# Patient Record
Sex: Male | Born: 1945 | Race: White | Hispanic: No | Marital: Married | Smoking: Former smoker
Health system: Southern US, Community
[De-identification: ages and names within clinical notes are randomized; demographics above are authoritative.]

## PROBLEM LIST (undated history)

## (undated) DIAGNOSIS — G4733 Obstructive sleep apnea (adult) (pediatric): Secondary | ICD-10-CM

## (undated) DIAGNOSIS — D649 Anemia, unspecified: Secondary | ICD-10-CM

## (undated) DIAGNOSIS — G4730 Sleep apnea, unspecified: Secondary | ICD-10-CM

## (undated) DIAGNOSIS — I428 Other cardiomyopathies: Secondary | ICD-10-CM

## (undated) DIAGNOSIS — K219 Gastro-esophageal reflux disease without esophagitis: Secondary | ICD-10-CM

## (undated) DIAGNOSIS — E782 Mixed hyperlipidemia: Secondary | ICD-10-CM

## (undated) DIAGNOSIS — I499 Cardiac arrhythmia, unspecified: Secondary | ICD-10-CM

## (undated) DIAGNOSIS — R011 Cardiac murmur, unspecified: Secondary | ICD-10-CM

## (undated) DIAGNOSIS — U071 COVID-19: Secondary | ICD-10-CM

## (undated) DIAGNOSIS — I10 Essential (primary) hypertension: Secondary | ICD-10-CM

## (undated) DIAGNOSIS — I429 Cardiomyopathy, unspecified: Secondary | ICD-10-CM

## (undated) HISTORY — PX: CARDIAC CATHETERIZATION: SHX172

---

## 1998-12-28 HISTORY — PX: LUNG SURGERY: SHX703

## 2004-02-04 ENCOUNTER — Encounter: Payer: Self-pay | Admitting: Family Medicine

## 2004-02-24 ENCOUNTER — Ambulatory Visit: Payer: Self-pay | Admitting: Otolaryngology

## 2004-02-27 ENCOUNTER — Encounter: Payer: Self-pay | Admitting: Family Medicine

## 2004-03-29 ENCOUNTER — Encounter: Payer: Self-pay | Admitting: Family Medicine

## 2004-04-29 ENCOUNTER — Encounter: Payer: Self-pay | Admitting: Family Medicine

## 2006-10-26 ENCOUNTER — Emergency Department (HOSPITAL_COMMUNITY): Admission: EM | Admit: 2006-10-26 | Discharge: 2006-10-26 | Payer: Self-pay | Admitting: Emergency Medicine

## 2011-01-11 LAB — DIFFERENTIAL
Basophils Absolute: 0
Basophils Relative: 1
Eosinophils Absolute: 0.1
Eosinophils Relative: 2
Lymphocytes Relative: 24
Lymphs Abs: 1.5
Monocytes Absolute: 0.4
Monocytes Relative: 7
Neutro Abs: 4.3
Neutrophils Relative %: 67

## 2011-01-11 LAB — POCT I-STAT CREATININE
Creatinine, Ser: 1
Operator id: 277751

## 2011-01-11 LAB — CBC
HCT: 41
Hemoglobin: 13.9
MCHC: 34
MCV: 93.2
Platelets: 161
RBC: 4.4
RDW: 13
WBC: 6.4

## 2011-01-11 LAB — URINALYSIS, ROUTINE W REFLEX MICROSCOPIC
Glucose, UA: NEGATIVE
Ketones, ur: 15 — AB
Leukocytes, UA: NEGATIVE
Nitrite: NEGATIVE
Protein, ur: NEGATIVE
Specific Gravity, Urine: 1.028
Urobilinogen, UA: 0.2
pH: 5

## 2011-01-11 LAB — I-STAT 8, (EC8 V) (CONVERTED LAB)
Acid-Base Excess: 1
BUN: 13
Bicarbonate: 26.4 — ABNORMAL HIGH
Chloride: 105
Glucose, Bld: 131 — ABNORMAL HIGH
HCT: 41
Hemoglobin: 13.9
Operator id: 277751
Potassium: 3.7
Sodium: 140
TCO2: 28
pCO2, Ven: 43.7 — ABNORMAL LOW
pH, Ven: 7.389 — ABNORMAL HIGH

## 2011-01-11 LAB — URINE MICROSCOPIC-ADD ON

## 2013-08-03 DIAGNOSIS — I10 Essential (primary) hypertension: Secondary | ICD-10-CM | POA: Insufficient documentation

## 2013-08-03 DIAGNOSIS — K219 Gastro-esophageal reflux disease without esophagitis: Secondary | ICD-10-CM | POA: Insufficient documentation

## 2013-08-03 DIAGNOSIS — G4733 Obstructive sleep apnea (adult) (pediatric): Secondary | ICD-10-CM | POA: Insufficient documentation

## 2014-02-14 DIAGNOSIS — E782 Mixed hyperlipidemia: Secondary | ICD-10-CM | POA: Insufficient documentation

## 2014-02-25 ENCOUNTER — Ambulatory Visit: Payer: Self-pay | Admitting: Internal Medicine

## 2014-03-28 ENCOUNTER — Ambulatory Visit: Payer: Self-pay | Admitting: Internal Medicine

## 2015-04-24 DIAGNOSIS — I428 Other cardiomyopathies: Secondary | ICD-10-CM | POA: Insufficient documentation

## 2016-04-28 DIAGNOSIS — I493 Ventricular premature depolarization: Secondary | ICD-10-CM | POA: Insufficient documentation

## 2016-04-28 DIAGNOSIS — I5020 Unspecified systolic (congestive) heart failure: Secondary | ICD-10-CM | POA: Insufficient documentation

## 2016-05-20 ENCOUNTER — Other Ambulatory Visit: Payer: Self-pay | Admitting: Internal Medicine

## 2016-05-20 DIAGNOSIS — M5412 Radiculopathy, cervical region: Secondary | ICD-10-CM

## 2016-06-02 ENCOUNTER — Ambulatory Visit: Payer: Medicare Other

## 2016-06-11 ENCOUNTER — Ambulatory Visit
Admission: RE | Admit: 2016-06-11 | Discharge: 2016-06-11 | Disposition: A | Discharge status: Home / Self Care | Payer: Medicare Other | Source: Ambulatory Visit | Attending: Internal Medicine | Admitting: Internal Medicine

## 2016-06-11 DIAGNOSIS — M4802 Spinal stenosis, cervical region: Secondary | ICD-10-CM | POA: Insufficient documentation

## 2016-06-11 DIAGNOSIS — M47896 Other spondylosis, lumbar region: Secondary | ICD-10-CM | POA: Diagnosis not present

## 2016-06-11 DIAGNOSIS — M5412 Radiculopathy, cervical region: Secondary | ICD-10-CM | POA: Diagnosis present

## 2016-06-11 MED ORDER — GADOBENATE DIMEGLUMINE 529 MG/ML IV SOLN
20.0000 mL | Freq: Once | INTRAVENOUS | Status: AC | PRN
  Administered 2016-06-11: 20 mL via INTRAVENOUS

## 2016-08-13 ENCOUNTER — Encounter: Payer: Self-pay | Admitting: *Deleted

## 2016-08-16 ENCOUNTER — Ambulatory Visit
Admission: RE | Admit: 2016-08-16 | Discharge: 2016-08-16 | Disposition: A | Discharge status: Home / Self Care | Payer: Medicare Other | Source: Ambulatory Visit | Attending: Unknown Physician Specialty | Admitting: Unknown Physician Specialty

## 2016-08-16 ENCOUNTER — Encounter: Payer: Self-pay | Admitting: *Deleted

## 2016-08-16 ENCOUNTER — Encounter
Admission: RE | Disposition: A | Discharge status: Home / Self Care | Payer: Self-pay | Source: Ambulatory Visit | Attending: Unknown Physician Specialty

## 2016-08-16 ENCOUNTER — Ambulatory Visit: Payer: Medicare Other | Admitting: Certified Registered"

## 2016-08-16 DIAGNOSIS — K640 First degree hemorrhoids: Secondary | ICD-10-CM | POA: Diagnosis not present

## 2016-08-16 DIAGNOSIS — D125 Benign neoplasm of sigmoid colon: Secondary | ICD-10-CM | POA: Insufficient documentation

## 2016-08-16 DIAGNOSIS — Z7982 Long term (current) use of aspirin: Secondary | ICD-10-CM | POA: Diagnosis not present

## 2016-08-16 DIAGNOSIS — D123 Benign neoplasm of transverse colon: Secondary | ICD-10-CM | POA: Insufficient documentation

## 2016-08-16 DIAGNOSIS — Z7951 Long term (current) use of inhaled steroids: Secondary | ICD-10-CM | POA: Diagnosis not present

## 2016-08-16 DIAGNOSIS — G4730 Sleep apnea, unspecified: Secondary | ICD-10-CM | POA: Insufficient documentation

## 2016-08-16 DIAGNOSIS — Z9889 Other specified postprocedural states: Secondary | ICD-10-CM | POA: Diagnosis not present

## 2016-08-16 DIAGNOSIS — D120 Benign neoplasm of cecum: Secondary | ICD-10-CM | POA: Diagnosis not present

## 2016-08-16 DIAGNOSIS — Z1211 Encounter for screening for malignant neoplasm of colon: Secondary | ICD-10-CM | POA: Diagnosis present

## 2016-08-16 DIAGNOSIS — Z79899 Other long term (current) drug therapy: Secondary | ICD-10-CM | POA: Diagnosis not present

## 2016-08-16 DIAGNOSIS — I10 Essential (primary) hypertension: Secondary | ICD-10-CM | POA: Insufficient documentation

## 2016-08-16 DIAGNOSIS — Z87891 Personal history of nicotine dependence: Secondary | ICD-10-CM | POA: Diagnosis not present

## 2016-08-16 DIAGNOSIS — K219 Gastro-esophageal reflux disease without esophagitis: Secondary | ICD-10-CM | POA: Insufficient documentation

## 2016-08-16 DIAGNOSIS — I429 Cardiomyopathy, unspecified: Secondary | ICD-10-CM | POA: Insufficient documentation

## 2016-08-16 HISTORY — DX: Sleep apnea, unspecified: G47.30

## 2016-08-16 HISTORY — PX: COLONOSCOPY WITH PROPOFOL: SHX5780

## 2016-08-16 HISTORY — DX: Essential (primary) hypertension: I10

## 2016-08-16 HISTORY — DX: Other cardiomyopathies: I42.8

## 2016-08-16 HISTORY — DX: Gastro-esophageal reflux disease without esophagitis: K21.9

## 2016-08-16 HISTORY — DX: Cardiomyopathy, unspecified: I42.9

## 2016-08-16 SURGERY — COLONOSCOPY WITH PROPOFOL
Anesthesia: General

## 2016-08-16 MED ORDER — SODIUM CHLORIDE 0.9 % IV SOLN: INTRAVENOUS | Status: DC

## 2016-08-16 MED ORDER — PROPOFOL 500 MG/50ML IV EMUL
INTRAVENOUS | Status: DC | PRN
  Administered 2016-08-16: 150 ug/kg/min via INTRAVENOUS

## 2016-08-16 MED ORDER — PROPOFOL 10 MG/ML IV BOLUS
INTRAVENOUS | Status: DC | PRN
  Administered 2016-08-16 (×2): 25 mg via INTRAVENOUS
  Administered 2016-08-16: 50 mg via INTRAVENOUS

## 2016-08-16 MED ORDER — SODIUM CHLORIDE 0.9 % IV SOLN
INTRAVENOUS | Status: DC
  Administered 2016-08-16: 10:00:00 via INTRAVENOUS

## 2016-08-16 MED ORDER — PROPOFOL 500 MG/50ML IV EMUL
INTRAVENOUS | Status: AC
  Filled 2016-08-16: qty 50

## 2016-08-16 MED ORDER — PROPOFOL 10 MG/ML IV BOLUS
INTRAVENOUS | Status: AC
  Filled 2016-08-16: qty 20

## 2016-08-16 NOTE — Op Note (Signed)
[facility] Patient Name: Ricky Petersen Procedure Date: 08/16/2016 9:47 AM MRN: [id number] Account #: [id number] Date of Birth: [birth year] Admit Type: Outpatient Age: 71 Room: [facility] ENDO ROOM 1 Gender: Male Note Status: Finalized Procedure:            Colonoscopy Indications:          Screening for colorectal malignant neoplasm Providers:            Manya Silvas, MD Referring MD:         Tracie Harrier, MD (Referring MD) Medicines:            Propofol per Anesthesia Complications:        No immediate complications. Procedure:            Pre-Anesthesia Assessment:                       - After reviewing the risks and benefits, the patient                        was deemed in satisfactory condition to undergo the                        procedure.                       - After reviewing the risks and benefits, the patient                        was deemed in satisfactory condition to undergo the                        procedure.                       After obtaining informed consent, the colonoscope was                        passed under direct vision. Throughout the procedure,                        the patient's blood pressure, pulse, and oxygen                        saturations were monitored continuously. The Olympus                        PCF-H180AL colonoscope ( S#: [id number] ) was introduced                        through the anus and advanced to the the cecum,                        identified by appendiceal orifice and ileocecal valve.                        The colonoscopy was somewhat difficult due to                        significant looping. Successful completion of the  procedure was aided by applying abdominal pressure. The                        patient tolerated the procedure well. The quality of                        the bowel preparation was good. Findings:      A diminutive polyp was found in the  recto-sigmoid colon. The polyp was       sessile. The polyp was removed with a hot snare. Resection and retrieval       were complete.      A diminutive polyp was found in the transverse colon. The polyp was       sessile. The polyp was removed with a cold snare. Resection and       retrieval were complete.      Two sessile polyps were found in the cecum. The polyps were diminutive       in size. These polyps were removed with a jumbo cold forceps. Resection       and retrieval were complete.      Internal hemorrhoids were found during endoscopy. The hemorrhoids were       small and Grade I (internal hemorrhoids that do not prolapse).      The exam was otherwise without abnormality. Impression:           - One diminutive polyp at the recto-sigmoid colon,                        removed with a hot snare. Resected and retrieved.                       - One diminutive polyp in the transverse colon, removed                        with a cold snare. Resected and retrieved.                       - Two diminutive polyps in the cecum, removed with a                        jumbo cold forceps. Resected and retrieved.                       - Internal hemorrhoids.                       - The examination was otherwise normal. Recommendation:       - Await pathology results. Manya Silvas, MD 08/16/2016 10:37:06 AM This report has been signed electronically. Number of Addenda: 0 Note Initiated On: 08/16/2016 9:47 AM Scope Withdrawal Time: 0 hours 24 minutes 44 seconds  Total Procedure Duration: 0 hours 40 minutes 16 seconds       [facility]

## 2016-08-16 NOTE — Transfer of Care (Signed)
Immediate Anesthesia Transfer of Care Note  Patient: Ricky Petersen  Procedure(s) Performed: Procedure(s): COLONOSCOPY WITH PROPOFOL (N/A)  Patient Location: PACU  Anesthesia Type:General  Level of Consciousness: sedated and responds to stimulation  Airway & Oxygen Therapy: Patient Spontanous Breathing and Patient connected to nasal cannula oxygen  Post-op Assessment: Report given to RN and Post -op Vital signs reviewed and stable  Post vital signs: Reviewed and stable  Last Vitals:  Vitals:   08/16/16 0923  BP: 129/73  Pulse: 60  Resp: 16  Temp: 36.7 C    Last Pain:  Vitals:   08/16/16 0923  TempSrc: Tympanic         Complications: No apparent anesthesia complications

## 2016-08-16 NOTE — H&P (Signed)
   Primary Care Physician:  Tracie Harrier, MD Primary Gastroenterologist:  Dr. Vira Agar  Pre-Procedure History & Physical: HPI:  Ricky Petersen is a 71 y.o. male is here for an colonoscopy.   Past Medical History:  Diagnosis Date  . GERD (gastroesophageal reflux disease)   . Hypertension   . Idiopathic cardiomyopathy ([place])   . Sleep apnea     Past Surgical History:  Procedure Laterality Date  . CARDIAC CATHETERIZATION      Prior to Admission medications   Medication Sig Start Date End Date Taking? Authorizing Provider  aspirin EC 81 MG tablet Take 81 mg by mouth daily.   Yes [provider]  fluticasone (FLONASE) 50 MCG/ACT nasal spray Place 2 sprays into both nostrils daily.   Yes [provider]  losartan (COZAAR) 100 MG tablet Take 100 mg by mouth daily.   Yes [provider]  metoprolol succinate (TOPROL-XL) 25 MG 24 hr tablet Take 25 mg by mouth daily.   Yes [provider]  omeprazole (PRILOSEC) 20 MG capsule Take 20 mg by mouth 2 (two) times daily before a meal.   Yes [provider]    Allergies as of 06/07/2016  . (Not on File)    History reviewed. No pertinent family history.  Social History   Social History  . Marital status: Married    Spouse name: N/A  . Number of children: N/A  . Years of education: N/A   Occupational History  . Not on file.   Social History Main Topics  . Smoking status: Former Research scientist (life sciences)  . Smokeless tobacco: Never Used  . Alcohol use Yes  . Drug use: No  . Sexual activity: Not on file   Other Topics Concern  . Not on file   Social History Narrative  . No narrative on file    Review of Systems: See HPI, otherwise negative ROS  Physical Exam: BP 129/73   Pulse 60   Temp 98 F (36.7 C) (Tympanic)   Resp 16   Ht 6\' 4"  (1.93 m)   Wt 104.3 kg (230 lb)   SpO2 98%   BMI 28.00 kg/m  General:   Alert,  pleasant and cooperative in NAD Head:  Normocephalic and  atraumatic. Neck:  Supple; no masses or thyromegaly. Lungs:  Clear throughout to auscultation.    Heart:  Regular rate and rhythm. Abdomen:  Soft, nontender and nondistended. Normal bowel sounds, without guarding, and without rebound.   Neurologic:  Alert and  oriented x4;  grossly normal neurologically.  Impression/Plan: Emigdio Wildeman Gens is here for an colonoscopy to be performed for screening colonoscopy  Risks, benefits, limitations, and alternatives regarding  colonoscopy have been reviewed with the patient.  Questions have been answered.  All parties agreeable.   Gaylyn Cheers, MD  08/16/2016, 9:46 AM

## 2016-08-16 NOTE — Anesthesia Postprocedure Evaluation (Signed)
Anesthesia Post Note  Patient: Ricky Petersen  Procedure(s) Performed: Procedure(s) (LRB): COLONOSCOPY WITH PROPOFOL (N/A)  Patient location during evaluation: Endoscopy Anesthesia Type: General Level of consciousness: awake and alert Pain management: pain level controlled Vital Signs Assessment: post-procedure vital signs reviewed and stable Respiratory status: spontaneous breathing and respiratory function stable Cardiovascular status: stable Anesthetic complications: no     Last Vitals:  Vitals:   08/16/16 0923 08/16/16 1038  BP: 129/73 101/77  Pulse: 60 71  Resp: 16 19  Temp: 36.7 C (!) 35.6 C    Last Pain:  Vitals:   08/16/16 1038  TempSrc: Tympanic                 KEPHART,WILLIAM K

## 2016-08-16 NOTE — Anesthesia Post-op Follow-up Note (Cosign Needed)
Anesthesia QCDR form completed.        

## 2016-08-16 NOTE — Anesthesia Procedure Notes (Signed)
Performed by: Sariah Henkin Pre-anesthesia Checklist: Patient identified, Emergency Drugs available, Suction available, Patient being monitored and Timeout performed Patient Re-evaluated:Patient Re-evaluated prior to inductionOxygen Delivery Method: Nasal cannula Preoxygenation: Pre-oxygenation with 100% oxygen Intubation Type: IV induction       

## 2016-08-16 NOTE — Anesthesia Preprocedure Evaluation (Signed)
Anesthesia Evaluation  Patient identified by MRN, date of birth, ID band Patient awake    Reviewed: Allergy & Precautions, NPO status , Patient's Chart, lab work & pertinent test results  History of Anesthesia Complications Negative for: history of anesthetic complications  Airway Mallampati: II       Dental   Pulmonary sleep apnea and Continuous Positive Airway Pressure Ventilation , former smoker,           Cardiovascular hypertension, Pt. on medications      Neuro/Psych negative neurological ROS     GI/Hepatic Neg liver ROS, GERD  Medicated and Controlled,  Endo/Other  negative endocrine ROS  Renal/GU negative Renal ROS     Musculoskeletal   Abdominal   Peds  Hematology negative hematology ROS (+)   Anesthesia Other Findings   Reproductive/Obstetrics                            Anesthesia Physical Anesthesia Plan  ASA: II  Anesthesia Plan: General   Post-op Pain Management:    Induction: Intravenous  Airway Management Planned: Nasal Cannula  Additional Equipment:   Intra-op Plan:   Post-operative Plan:   Informed Consent: I have reviewed the patients History and Physical, chart, labs and discussed the procedure including the risks, benefits and alternatives for the proposed anesthesia with the patient or authorized representative who has indicated his/her understanding and acceptance.     Plan Discussed with:   Anesthesia Plan Comments:         Anesthesia Quick Evaluation

## 2016-08-17 LAB — SURGICAL PATHOLOGY

## 2016-08-19 ENCOUNTER — Encounter: Payer: Self-pay | Admitting: Unknown Physician Specialty

## 2017-05-23 DIAGNOSIS — G250 Essential tremor: Secondary | ICD-10-CM | POA: Insufficient documentation

## 2018-12-07 ENCOUNTER — Other Ambulatory Visit: Payer: Self-pay

## 2018-12-07 DIAGNOSIS — Z20822 Contact with and (suspected) exposure to covid-19: Secondary | ICD-10-CM

## 2018-12-08 LAB — NOVEL CORONAVIRUS, NAA: SARS-CoV-2, NAA: DETECTED — AB

## 2018-12-17 ENCOUNTER — Emergency Department: Payer: Medicare Other

## 2018-12-17 ENCOUNTER — Encounter: Payer: Self-pay | Admitting: Emergency Medicine

## 2018-12-17 ENCOUNTER — Emergency Department
Admission: EM | Admit: 2018-12-17 | Discharge: 2018-12-18 | Disposition: A | Discharge status: Other Acute Inpatient Hospital | Payer: Medicare Other | Attending: Emergency Medicine | Admitting: Emergency Medicine

## 2018-12-17 ENCOUNTER — Other Ambulatory Visit: Payer: Self-pay

## 2018-12-17 ENCOUNTER — Encounter: Payer: Self-pay | Admitting: Internal Medicine

## 2018-12-17 DIAGNOSIS — Z87891 Personal history of nicotine dependence: Secondary | ICD-10-CM | POA: Insufficient documentation

## 2018-12-17 DIAGNOSIS — R0902 Hypoxemia: Secondary | ICD-10-CM

## 2018-12-17 DIAGNOSIS — I10 Essential (primary) hypertension: Secondary | ICD-10-CM | POA: Diagnosis not present

## 2018-12-17 DIAGNOSIS — U071 COVID-19: Secondary | ICD-10-CM

## 2018-12-17 DIAGNOSIS — J9601 Acute respiratory failure with hypoxia: Secondary | ICD-10-CM

## 2018-12-17 DIAGNOSIS — R0602 Shortness of breath: Secondary | ICD-10-CM | POA: Diagnosis present

## 2018-12-17 LAB — CBC WITH DIFFERENTIAL/PLATELET
Abs Immature Granulocytes: 0.07 10*3/uL (ref 0.00–0.07)
Basophils Absolute: 0 10*3/uL (ref 0.0–0.1)
Basophils Relative: 0 %
Eosinophils Absolute: 0 10*3/uL (ref 0.0–0.5)
Eosinophils Relative: 0 %
HCT: 40.3 % (ref 39.0–52.0)
Hemoglobin: 14.4 g/dL (ref 13.0–17.0)
Immature Granulocytes: 1 %
Lymphocytes Relative: 13 %
Lymphs Abs: 1 10*3/uL (ref 0.7–4.0)
MCH: 32.2 pg (ref 26.0–34.0)
MCHC: 35.7 g/dL (ref 30.0–36.0)
MCV: 90.2 fL (ref 80.0–100.0)
Monocytes Absolute: 0.6 10*3/uL (ref 0.1–1.0)
Monocytes Relative: 8 %
Neutro Abs: 6.2 10*3/uL (ref 1.7–7.7)
Neutrophils Relative %: 78 %
Platelets: 171 10*3/uL (ref 150–400)
RBC: 4.47 MIL/uL (ref 4.22–5.81)
RDW: 12.7 % (ref 11.5–15.5)
WBC: 7.9 10*3/uL (ref 4.0–10.5)
nRBC: 0 % (ref 0.0–0.2)

## 2018-12-17 LAB — BASIC METABOLIC PANEL
Anion gap: 14 (ref 5–15)
BUN: 16 mg/dL (ref 8–23)
CO2: 27 mmol/L (ref 22–32)
Calcium: 9.1 mg/dL (ref 8.9–10.3)
Chloride: 89 mmol/L — ABNORMAL LOW (ref 98–111)
Creatinine, Ser: 0.94 mg/dL (ref 0.61–1.24)
GFR calc Af Amer: >60 mL/min (ref >60)
GFR calc non Af Amer: >60 mL/min (ref >60)
Glucose, Bld: 120 mg/dL — ABNORMAL HIGH (ref 70–99)
Potassium: 3.4 mmol/L — ABNORMAL LOW (ref 3.5–5.1)
Sodium: 130 mmol/L — ABNORMAL LOW (ref 135–145)

## 2018-12-17 LAB — FIBRIN DERIVATIVES D-DIMER (ARMC ONLY): Fibrin derivatives D-dimer (ARMC): 977.66 ng/mL (FEU) — ABNORMAL HIGH (ref 0.00–499.00)

## 2018-12-17 LAB — TROPONIN I (HIGH SENSITIVITY): Troponin I (High Sensitivity): 26 ng/L — ABNORMAL HIGH (ref <18)

## 2018-12-17 LAB — BRAIN NATRIURETIC PEPTIDE: B Natriuretic Peptide: 76 pg/mL (ref 0.0–100.0)

## 2018-12-17 MED ORDER — AZITHROMYCIN 500 MG PO TABS
500.0000 mg | ORAL_TABLET | Freq: Once | ORAL | Status: AC
  Administered 2018-12-17: 500 mg via ORAL
  Filled 2018-12-17: qty 1

## 2018-12-17 MED ORDER — IPRATROPIUM-ALBUTEROL 0.5-2.5 (3) MG/3ML IN SOLN
3.0000 mL | Freq: Once | RESPIRATORY_TRACT | Status: AC
  Administered 2018-12-17: 3 mL via RESPIRATORY_TRACT
  Filled 2018-12-17: qty 3

## 2018-12-17 MED ORDER — METHYLPREDNISOLONE SODIUM SUCC 125 MG IJ SOLR
125.0000 mg | Freq: Once | INTRAMUSCULAR | Status: AC
  Administered 2018-12-17: 125 mg via INTRAVENOUS
  Filled 2018-12-17: qty 2

## 2018-12-17 MED ORDER — HYDROXYCHLOROQUINE SULFATE 200 MG PO TABS
400.0000 mg | ORAL_TABLET | Freq: Two times a day (BID) | ORAL | Status: DC
  Administered 2018-12-17: 400 mg via ORAL
  Filled 2018-12-17 (×2): qty 2

## 2018-12-17 NOTE — ED Provider Notes (Signed)
[facility] Emergency Department Provider Note       Time seen: ----------------------------------------- 3:26 PM on 12/17/2018 -----------------------------------------   I have reviewed the triage vital signs and the nursing notes.  HISTORY   Chief Complaint Shortness of Breath    HPI Ricky Petersen is a 73 y.o. male with a history of GERD, hypertension, cardiomyopathy, sleep apnea who presents to the ED for weakness and low oxygen levels.  Patient reportedly tested positive for COVID.  He is on day 10 of a quarantine.  He was sent by his primary care doctor for chest x-ray and evaluation.  He denies any pain.  Patient states his shortness of breath is worse when he is ambulating.  He denies any other complaints, wife is also covered positive but is asymptomatic.  Past Medical History:  Diagnosis Date  . GERD (gastroesophageal reflux disease)   . Hypertension   . Idiopathic cardiomyopathy ([place])   . Sleep apnea     There are no active problems to display for this patient.   Past Surgical History:  Procedure Laterality Date  . CARDIAC CATHETERIZATION    . COLONOSCOPY WITH PROPOFOL N/A 08/16/2016   Procedure: COLONOSCOPY WITH PROPOFOL;  Surgeon: Manya Silvas, MD;  Location: [facility] ENDOSCOPY;  Service: Endoscopy;  Laterality: N/A;    Allergies Patient has no known allergies.  Social History Social History   Tobacco Use  . Smoking status: Former Research scientist (life sciences)  . Smokeless tobacco: Never Used  Substance Use Topics  . Alcohol use: Yes  . Drug use: No   Review of Systems Constitutional: Negative for fever. Cardiovascular: Negative for chest pain. Respiratory: Positive for shortness of breath Gastrointestinal: Negative for abdominal pain, vomiting and diarrhea. Musculoskeletal: Negative for back pain. Skin: Negative for rash. Neurological: Positive for weakness  All systems negative/normal/unremarkable except as stated in the  HPI  ____________________________________________   PHYSICAL EXAM:  VITAL SIGNS: ED Triage Vitals  Enc Vitals Group     BP 12/17/18 1522 123/79     Pulse Rate 12/17/18 1522 65     Resp 12/17/18 1522 18     Temp 12/17/18 1522 98 F (36.7 C)     Temp Source 12/17/18 1522 Oral     SpO2 12/17/18 1522 (!) 85 %     Weight 12/17/18 1521 229 lb 15 oz (104.3 kg)     Height --      Head Circumference --      Peak Flow --      Pain Score 12/17/18 1521 0     Pain Loc --      Pain Edu? --      Excl. in [place]? --    Constitutional: Alert and oriented. Well appearing and in no distress. Eyes: Conjunctivae are normal. Normal extraocular movements. ENT      Head: Normocephalic and atraumatic.      Nose: No congestion/rhinnorhea.      Mouth/Throat: Mucous membranes are moist.      Neck: No stridor. Cardiovascular: Normal rate, regular rhythm. No murmurs, rubs, or gallops. Respiratory: Normal respiratory effort without tachypnea nor retractions. Breath sounds are clear and equal bilaterally. No wheezes/rales/rhonchi. Gastrointestinal: Soft and nontender. Normal bowel sounds Musculoskeletal: Nontender with normal range of motion in extremities. No lower extremity tenderness nor edema. Neurologic:  Normal speech and language. No gross focal neurologic deficits are appreciated.  Skin:  Skin is warm, dry and intact. No rash noted. Psychiatric: Mood and affect are normal. Speech and behavior are  normal.  ____________________________________________  EKG: Interpreted by me.  ____________________________________________  ED COURSE:  As part of my medical decision making, I reviewed the following data within the electronic MEDICAL RECORD NUMBER History obtained from family if available, nursing notes, old chart and ekg, as well as notes from prior ED visits. Patient presented for dyspnea with COVID-19, we will assess with labs and imaging as indicated at this time.   Procedures  Darian Pepin Nell was  evaluated in Emergency Department on 12/17/2018 for the symptoms described in the history of present illness. He was evaluated in the context of the global COVID-19 pandemic, which necessitated consideration that the patient might be at risk for infection with the SARS-CoV-2 virus that causes COVID-19. Institutional protocols and algorithms that pertain to the evaluation of patients at risk for COVID-19 are in a state of rapid change based on information released by regulatory bodies including the CDC and federal and state organizations. These policies and algorithms were followed during the patient's care in the ED.  ____________________________________________   LABS (pertinent positives/negatives)  Labs Reviewed  BASIC METABOLIC PANEL - Abnormal; Notable for the following components:      Result Value   Sodium 130 (*)    Potassium 3.4 (*)    Chloride 89 (*)    Glucose, Bld 120 (*)    All other components within normal limits  FIBRIN DERIVATIVES D-DIMER ([facility] ONLY) - Abnormal; Notable for the following components:   Fibrin derivatives D-dimer ([facility]) 977.66 (*)    All other components within normal limits  TROPONIN I (HIGH SENSITIVITY) - Abnormal; Notable for the following components:   Troponin I (High Sensitivity) 26 (*)    All other components within normal limits  CBC WITH DIFFERENTIAL/PLATELET  BRAIN NATRIURETIC PEPTIDE    RADIOLOGY Images were viewed by me  Chest x-ray IMPRESSION:  1. Borderline cardiomegaly.  2. Streaky bilateral perihilar and basilar opacities. This could  represent pneumonia, atelectasis or asymmetric edema.  ____________________________________________   DIFFERENTIAL DIAGNOSIS   COVID-19, pneumonia, CHF, COPD, PE  FINAL ASSESSMENT AND PLAN  COVID-19, hypoxia   Plan: The patient had presented for COVID-19 with low oxygen levels. Patient's labs were overall unremarkable. Patient's imaging revealed perihilar and basilar opacities.  I did start him on  hydroxychloroquine, he received a dose of Solu-Medrol and azithromycin.  Patient is oxygen dependent at this point and will need hospitalization.  I will discuss with the [place].   Laurence Aly, MD    Note: This note was generated in part or whole with voice recognition software. Voice recognition is usually quite accurate but there are transcription errors that can and very often do occur. I apologize for any typographical errors that were not detected and corrected.     Earleen Newport, MD 12/17/18 [phone]

## 2018-12-17 NOTE — ED Triage Notes (Signed)
C/O weakness and oxygen levels down.  Patient has tested positive for COVID.  States he is no day 10 of quarantine.  Sent in by PCP for CXR and ED evaluation.

## 2018-12-17 NOTE — ED Notes (Signed)
Report finished att; Taran at [facility] reports crew on call - may call later for report

## 2018-12-17 NOTE — ED Notes (Signed)
Pt placed on 3L via [place] at this time, noted to desat to 88% on 2L via [place]. Pt denies further needs. Will continue to monitor for further patient needs.

## 2018-12-17 NOTE — ED Notes (Signed)
Called [facility] spoke to Abbe Amsterdam to initiate transfer to [place]

## 2018-12-18 ENCOUNTER — Other Ambulatory Visit: Payer: Self-pay

## 2018-12-18 ENCOUNTER — Inpatient Hospital Stay (HOSPITAL_COMMUNITY)
Admission: AD | Admit: 2018-12-18 | Discharge: 2018-12-23 | DRG: 177 | Disposition: A | Discharge status: Home / Self Care | Payer: Medicare Other | Source: Other Acute Inpatient Hospital | Attending: Internal Medicine | Admitting: Internal Medicine

## 2018-12-18 ENCOUNTER — Encounter (HOSPITAL_COMMUNITY): Payer: Self-pay

## 2018-12-18 DIAGNOSIS — Z79899 Other long term (current) drug therapy: Secondary | ICD-10-CM

## 2018-12-18 DIAGNOSIS — I5022 Chronic systolic (congestive) heart failure: Secondary | ICD-10-CM | POA: Diagnosis present

## 2018-12-18 DIAGNOSIS — E871 Hypo-osmolality and hyponatremia: Secondary | ICD-10-CM | POA: Diagnosis not present

## 2018-12-18 DIAGNOSIS — J9601 Acute respiratory failure with hypoxia: Secondary | ICD-10-CM

## 2018-12-18 DIAGNOSIS — U071 COVID-19: Secondary | ICD-10-CM | POA: Diagnosis present

## 2018-12-18 DIAGNOSIS — J80 Acute respiratory distress syndrome: Secondary | ICD-10-CM

## 2018-12-18 DIAGNOSIS — G4733 Obstructive sleep apnea (adult) (pediatric): Secondary | ICD-10-CM | POA: Diagnosis present

## 2018-12-18 DIAGNOSIS — Z7951 Long term (current) use of inhaled steroids: Secondary | ICD-10-CM

## 2018-12-18 DIAGNOSIS — I493 Ventricular premature depolarization: Secondary | ICD-10-CM | POA: Diagnosis present

## 2018-12-18 DIAGNOSIS — Z7982 Long term (current) use of aspirin: Secondary | ICD-10-CM | POA: Diagnosis not present

## 2018-12-18 DIAGNOSIS — I10 Essential (primary) hypertension: Secondary | ICD-10-CM | POA: Diagnosis not present

## 2018-12-18 DIAGNOSIS — I428 Other cardiomyopathies: Secondary | ICD-10-CM | POA: Diagnosis present

## 2018-12-18 DIAGNOSIS — K219 Gastro-esophageal reflux disease without esophagitis: Secondary | ICD-10-CM | POA: Diagnosis present

## 2018-12-18 DIAGNOSIS — E782 Mixed hyperlipidemia: Secondary | ICD-10-CM | POA: Diagnosis present

## 2018-12-18 DIAGNOSIS — Z9989 Dependence on other enabling machines and devices: Secondary | ICD-10-CM | POA: Diagnosis not present

## 2018-12-18 DIAGNOSIS — I110 Hypertensive heart disease with heart failure: Secondary | ICD-10-CM | POA: Diagnosis present

## 2018-12-18 DIAGNOSIS — J1289 Other viral pneumonia: Secondary | ICD-10-CM | POA: Diagnosis present

## 2018-12-18 LAB — COMPREHENSIVE METABOLIC PANEL
ALT: 35 U/L (ref 0–44)
AST: 40 U/L (ref 15–41)
Albumin: 3.1 g/dL — ABNORMAL LOW (ref 3.5–5.0)
Alkaline Phosphatase: 34 U/L — ABNORMAL LOW (ref 38–126)
Anion gap: 15 (ref 5–15)
BUN: 15 mg/dL (ref 8–23)
CO2: 26 mmol/L (ref 22–32)
Calcium: 8.9 mg/dL (ref 8.9–10.3)
Chloride: 91 mmol/L — ABNORMAL LOW (ref 98–111)
Creatinine, Ser: 0.83 mg/dL (ref 0.61–1.24)
GFR calc Af Amer: >60 mL/min (ref >60)
GFR calc non Af Amer: >60 mL/min (ref >60)
Glucose, Bld: 166 mg/dL — ABNORMAL HIGH (ref 70–99)
Potassium: 3.4 mmol/L — ABNORMAL LOW (ref 3.5–5.1)
Sodium: 132 mmol/L — ABNORMAL LOW (ref 135–145)
Total Bilirubin: 0.8 mg/dL (ref 0.3–1.2)
Total Protein: 6.6 g/dL (ref 6.5–8.1)

## 2018-12-18 LAB — CBC
HCT: 38.9 % — ABNORMAL LOW (ref 39.0–52.0)
Hemoglobin: 13.7 g/dL (ref 13.0–17.0)
MCH: 32.5 pg (ref 26.0–34.0)
MCHC: 35.2 g/dL (ref 30.0–36.0)
MCV: 92.4 fL (ref 80.0–100.0)
Platelets: 180 10*3/uL (ref 150–400)
RBC: 4.21 MIL/uL — ABNORMAL LOW (ref 4.22–5.81)
RDW: 12.7 % (ref 11.5–15.5)
WBC: 4.9 10*3/uL (ref 4.0–10.5)
nRBC: 0 % (ref 0.0–0.2)

## 2018-12-18 LAB — ABO/RH: ABO/RH(D): O NEG

## 2018-12-18 LAB — C-REACTIVE PROTEIN: CRP: 13.9 mg/dL — ABNORMAL HIGH (ref <1.0)

## 2018-12-18 LAB — D-DIMER, QUANTITATIVE: D-Dimer, Quant: 1.6 ug/mL-FEU — ABNORMAL HIGH (ref 0.00–0.50)

## 2018-12-18 LAB — TSH: TSH: 0.429 u[IU]/mL (ref 0.350–4.500)

## 2018-12-18 LAB — T4, FREE: Free T4: 1.18 ng/dL — ABNORMAL HIGH (ref 0.61–1.12)

## 2018-12-18 MED ORDER — ONDANSETRON HCL 4 MG/2ML IJ SOLN: 4.0000 mg | Freq: Four times a day (QID) | INTRAMUSCULAR | Status: DC | PRN

## 2018-12-18 MED ORDER — ADULT MULTIVITAMIN W/MINERALS CH
1.0000 | ORAL_TABLET | Freq: Every day | ORAL | Status: DC
  Administered 2018-12-18 – 2018-12-23 (×6): 1 via ORAL
  Filled 2018-12-18 (×6): qty 1

## 2018-12-18 MED ORDER — SENNOSIDES-DOCUSATE SODIUM 8.6-50 MG PO TABS: 1.0000 | ORAL_TABLET | Freq: Every evening | ORAL | Status: DC | PRN

## 2018-12-18 MED ORDER — LOSARTAN POTASSIUM 25 MG PO TABS
100.0000 mg | ORAL_TABLET | Freq: Every day | ORAL | Status: DC
  Administered 2018-12-18 – 2018-12-22 (×5): 100 mg via ORAL
  Filled 2018-12-18 (×6): qty 4

## 2018-12-18 MED ORDER — SODIUM CHLORIDE 0.9% IV SOLUTION
Freq: Once | INTRAVENOUS | Status: AC
  Administered 2018-12-18: 12:00:00 via INTRAVENOUS

## 2018-12-18 MED ORDER — ONDANSETRON HCL 4 MG PO TABS: 4.0000 mg | ORAL_TABLET | Freq: Four times a day (QID) | ORAL | Status: DC | PRN

## 2018-12-18 MED ORDER — FOLIC ACID 1 MG PO TABS
1.0000 mg | ORAL_TABLET | Freq: Every day | ORAL | Status: DC
  Administered 2018-12-18 – 2018-12-23 (×6): 1 mg via ORAL
  Filled 2018-12-18 (×6): qty 1

## 2018-12-18 MED ORDER — GUAIFENESIN-DM 100-10 MG/5ML PO SYRP: 10.0000 mL | ORAL_SOLUTION | ORAL | Status: DC | PRN

## 2018-12-18 MED ORDER — POTASSIUM CHLORIDE CRYS ER 20 MEQ PO TBCR
30.0000 meq | EXTENDED_RELEASE_TABLET | Freq: Once | ORAL | Status: AC
  Administered 2018-12-18: 30 meq via ORAL
  Filled 2018-12-18: qty 2

## 2018-12-18 MED ORDER — PANTOPRAZOLE SODIUM 40 MG PO TBEC
40.0000 mg | DELAYED_RELEASE_TABLET | Freq: Every day | ORAL | Status: DC
  Administered 2018-12-18 – 2018-12-23 (×6): 40 mg via ORAL
  Filled 2018-12-18 (×6): qty 1

## 2018-12-18 MED ORDER — IPRATROPIUM-ALBUTEROL 20-100 MCG/ACT IN AERS
1.0000 | INHALATION_SPRAY | Freq: Four times a day (QID) | RESPIRATORY_TRACT | Status: DC | PRN
  Filled 2018-12-18: qty 4

## 2018-12-18 MED ORDER — ALUM & MAG HYDROXIDE-SIMETH 200-200-20 MG/5ML PO SUSP
15.0000 mL | Freq: Once | ORAL | Status: DC | PRN
  Filled 2018-12-18: qty 30

## 2018-12-18 MED ORDER — SODIUM CHLORIDE 0.9 % IV SOLN
200.0000 mg | Freq: Once | INTRAVENOUS | Status: AC
  Administered 2018-12-18: 200 mg via INTRAVENOUS
  Filled 2018-12-18: qty 40

## 2018-12-18 MED ORDER — ACETAMINOPHEN 325 MG PO TABS: 650.0000 mg | ORAL_TABLET | Freq: Four times a day (QID) | ORAL | Status: DC | PRN

## 2018-12-18 MED ORDER — DEXAMETHASONE 6 MG PO TABS
6.0000 mg | ORAL_TABLET | ORAL | Status: DC
  Administered 2018-12-18 – 2018-12-23 (×6): 6 mg via ORAL
  Filled 2018-12-18 (×6): qty 1

## 2018-12-18 MED ORDER — ASPIRIN EC 81 MG PO TBEC
81.0000 mg | DELAYED_RELEASE_TABLET | Freq: Every day | ORAL | Status: DC
  Administered 2018-12-18 – 2018-12-23 (×6): 81 mg via ORAL
  Filled 2018-12-18 (×6): qty 1

## 2018-12-18 MED ORDER — TOCILIZUMAB 400 MG/20ML IV SOLN
800.0000 mg | Freq: Once | INTRAVENOUS | Status: AC
  Administered 2018-12-18: 800 mg via INTRAVENOUS
  Filled 2018-12-18: qty 40

## 2018-12-18 MED ORDER — FLUTICASONE PROPIONATE 50 MCG/ACT NA SUSP
2.0000 | Freq: Every day | NASAL | Status: DC
  Administered 2018-12-18 – 2018-12-23 (×6): 2 via NASAL
  Filled 2018-12-18: qty 16

## 2018-12-18 MED ORDER — METOPROLOL SUCCINATE ER 25 MG PO TB24
25.0000 mg | ORAL_TABLET | Freq: Every day | ORAL | Status: DC
  Administered 2018-12-18: 25 mg via ORAL
  Filled 2018-12-18: qty 1

## 2018-12-18 MED ORDER — ENOXAPARIN SODIUM 40 MG/0.4ML ~~LOC~~ SOLN
40.0000 mg | Freq: Every day | SUBCUTANEOUS | Status: DC
  Administered 2018-12-18 – 2018-12-23 (×6): 40 mg via SUBCUTANEOUS
  Filled 2018-12-18 (×7): qty 0.4

## 2018-12-18 MED ORDER — SODIUM CHLORIDE 0.9 % IV SOLN
100.0000 mg | INTRAVENOUS | Status: AC
  Administered 2018-12-19 – 2018-12-22 (×4): 100 mg via INTRAVENOUS
  Filled 2018-12-18 (×5): qty 20

## 2018-12-18 MED ORDER — METOPROLOL SUCCINATE ER 25 MG PO TB24
50.0000 mg | ORAL_TABLET | Freq: Every day | ORAL | Status: DC
  Administered 2018-12-19 – 2018-12-22 (×4): 50 mg via ORAL
  Filled 2018-12-18 (×5): qty 2

## 2018-12-18 MED ORDER — TRAMADOL HCL 50 MG PO TABS: 50.0000 mg | ORAL_TABLET | Freq: Four times a day (QID) | ORAL | Status: DC | PRN

## 2018-12-18 NOTE — Assessment & Plan Note (Signed)
Stable

## 2018-12-18 NOTE — Assessment & Plan Note (Signed)
Continue Cozaar and Toprol-XL

## 2018-12-18 NOTE — Assessment & Plan Note (Signed)
Continue PPI ?

## 2018-12-18 NOTE — Progress Notes (Addendum)
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  He was initially diagnosed with Covid 11 days ago on 12/07/2018, and was monitoring his symptoms at home.  Over the past 3 days he has been feeling short of breath and decided to come to the ER.  Chest x-ray showed bilateral infiltrates, he was hypoxic requiring initially 4 L nasal cannula and was admitted to the hospital  Significant events: 9/21 admit, rapid increase in O2 requirements 4L to 15 L 9/21 Remdesevir >> 9/21 Steroids >> 9/21 Convalescent plasma >>  Subjective / 24h Interval events: -Feeling a little bit more short of breath this morning. No chest pain, no palpitations  -Overnight went from 4 L to 15 L high flow  Assessment & Plan: Principal Problem:   Pneumonia due to COVID-19 virus Active Problems:   Cardiomyopathy, nonischemic (HCC)   Essential hypertension   GERD (gastroesophageal reflux disease)   Mixed hyperlipidemia   OSA on CPAP   Ventricular premature depolarization   Acute respiratory failure with hypoxia (HCC)   Hyponatremia   Principal Problem Acute Hypoxic Respiratory Failure due to Covid-19 Viral Illness -patients oxygen requirements increased significantly overnight from 4L [place] on admission to 15L this morning.  He is feeling okay at rest.  On my interview in the room his O2 sats are in the upper 80s.  We will closely monitor patient today, he appears to be developing ARDS, low threshold to move to the ICU if his oxygen requirements further increase -patient consented for Convalescent plasma.  Risks/benefits discussed with the patient, he was given material to read and agrees to plasma infusion -Given elevated CRP and  rapid progression of his hypoxia I have also discussed about use of Actemra, risks, benefits and that can potentially minimize the inflammatory response. The treatment plan and use of medications and known side effects were discussed with patient/family, they were clearly explained that there is no proven definitive treatment for COVID-19 infection, any medications used here are based on published clinical articles/anecdotal data which are not peer-reviewed or randomized control trials.  Complete risks and long-term side effects are unknown, however in the best clinical judgment they seem to be of some clinical benefit rather than medical risks.  Patient agree with the treatment plan and want to receive the given medications.  Patient agreeable to Actemra.  At patient's wishes, this was discussed with his wife as well as with his brother-in-law who is a cardiologist Carver Fila [phone]). -We will give Actemra x1 today  COVID-19 Labs  Recent Labs    12/18/18 0425  DDIMER 1.60*  CRP 13.9*    Lab Results  Component Value Date   SARSCOV2NAA Detected (A) 12/07/2018    Active Problems Nonischemic cardiomyopathy -Previous EF 40%, currently appears euvolemic.  This is thought to be due to PVC burden, and he was supposed to have an ablation at [facility] on 9/15 which was canceled because of the Covid diagnosis  Essential hypertension -Continue Cozaar and Toprol, blood pressure stable  GERD -Continue PPI  OSA on CPAP -Unable to use CPAP in the hospital  PVCs -Continue Toprol-XL, EKG shows sinus rhythm  Hyponatremia -Monitor, stable  Scheduled Meds: . sodium chloride   Intravenous Once  . aspirin EC  81 mg Oral Daily  . dexamethasone  6 mg Oral Q24H  . enoxaparin (LOVENOX) injection  40 mg Subcutaneous Daily  . fluticasone  2 spray Each Nare Daily  . folic acid  1 mg Oral Daily  . losartan  100 mg Oral Daily  . metoprolol succinate  25 mg Oral Daily  . multivitamin with minerals  1  tablet Oral Daily  . pantoprazole  40 mg Oral Daily   Continuous Infusions: . [START ON 12/19/2018] remdesivir 100 mg in NS 250 mL     PRN Meds:.acetaminophen, guaiFENesin-dextromethorphan, Ipratropium-Albuterol, ondansetron **OR** ondansetron (ZOFRAN) IV, senna-docusate, traMADol  DVT prophylaxis: Lovenox Code Status: Full code Family Communication: Called wife in the afternoon Disposition Plan: To be determined  Consultants:  None  Procedures:  None   Microbiology: None   Antimicrobials: None    Objective: Vitals:   12/18/18 0100 12/18/18 0700  BP: 127/85 123/69  Pulse: 60 60  Resp: 19 (!) 22  Temp: 98.2 F (36.8 C) 97.8 F (36.6 C)  TempSrc: Oral   SpO2: 93%   Weight: 105.8 kg   Height: 6\' 4"  (1.93 m)     Intake/Output Summary (Last 24 hours) at 12/18/2018 0957 Last data filed at 12/18/2018 0400 Gross per 24 hour  Intake 500 ml  Output 550 ml  Net -50 ml   Filed Weights   12/18/18 0100  Weight: 105.8 kg    Examination:  Constitutional: NAD Eyes: PERRL, lids and conjunctivae normal ENMT: Mucous membranes are moist. No oropharyngeal exudates Neck: normal, supple, no masses, no thyromegaly Respiratory: Rhonchi bilaterally, no wheezing, tachypneic Cardiovascular: Regular rate and rhythm, no murmurs / rubs / gallops. No LE edema. Abdomen: no tenderness. Bowel sounds positive.  Musculoskeletal: no clubbing / cyanosis.  Skin: no rashes Neurologic: CN 2-12 grossly intact. Strength 5/5 in all 4.  Psychiatric: Normal judgment and insight. Alert and oriented x 3. Normal mood.    Data Reviewed: I have independently reviewed following labs and imaging studies   CBC: Recent Labs  Lab 12/17/18 1610 12/18/18 0425  WBC 7.9 4.9  NEUTROABS 6.2  --   HGB 14.4 13.7  HCT 40.3 38.9*  MCV 90.2 92.4  PLT 171 [id number]   Basic Metabolic Panel: Recent Labs  Lab 12/17/18 1610 12/18/18 0425  NA 130* 132*  K 3.4* 3.4*  CL 89* 91*  CO2 27 26  GLUCOSE 120* 166*   BUN 16 15  CREATININE 0.94 0.83  CALCIUM 9.1 8.9   GFR: Estimated Creatinine Clearance: 104.3 mL/min (by C-G formula based on SCr of 0.83 mg/dL). Liver Function Tests: Recent Labs  Lab 12/18/18 0425  AST 40  ALT 35  ALKPHOS 34*  BILITOT 0.8  PROT 6.6  ALBUMIN 3.1*   No results for input(s): LIPASE, AMYLASE in the last 168 hours. No results for input(s): AMMONIA in the last 168 hours. Coagulation Profile: No results for input(s): INR, PROTIME in the last 168 hours. Cardiac Enzymes: No results for input(s): CKTOTAL, CKMB, CKMBINDEX, TROPONINI in the last 168 hours. BNP (last 3 results) No results for input(s): PROBNP in the last 8760 hours. HbA1C: No results for input(s): HGBA1C in the last 72 hours. CBG: No results for input(s): GLUCAP in the last 168 hours. Lipid Profile: No results for input(s): CHOL, HDL, LDLCALC, TRIG, CHOLHDL, LDLDIRECT in the last 72 hours. Thyroid Function Tests: Recent Labs    12/18/18 0425  TSH 0.429  FREET4 1.18*   Anemia Panel: No results for input(s): VITAMINB12, FOLATE, FERRITIN,  TIBC, IRON, RETICCTPCT in the last 72 hours. Urine analysis:    Component Value Date/Time   COLORURINE AMBER BIOCHEMICALS MAY BE AFFECTED BY COLOR (A) 10/26/2006 0420   APPEARANCEUR CLOUDY (A) 10/26/2006 0420   LABSPEC 1.028 10/26/2006 0420   PHURINE 5.0 10/26/2006 0420   GLUCOSEU NEGATIVE 10/26/2006 0420   HGBUR MODERATE (A) 10/26/2006 0420   BILIRUBINUR SMALL (A) 10/26/2006 0420   KETONESUR 15 (A) 10/26/2006 0420   PROTEINUR NEGATIVE 10/26/2006 0420   UROBILINOGEN 0.2 10/26/2006 0420   NITRITE NEGATIVE 10/26/2006 0420   LEUKOCYTESUR NEGATIVE 10/26/2006 0420   Sepsis Labs: Invalid input(s): PROCALCITONIN, LACTICIDVEN  No results found for this or any previous visit (from the past 240 hour(s)).    Radiology Studies: Dg Chest Port 1 View  Result Date: 12/17/2018 CLINICAL DATA:  Dyspnea, COVID-19. EXAM: PORTABLE CHEST 1 VIEW COMPARISON:  None.  FINDINGS: Borderline cardiomegaly. Streaky bilateral perihilar and bibasilar opacities. No pleural effusion or pneumothorax seen. Osseous structures about the chest are unremarkable. IMPRESSION: 1. Borderline cardiomegaly. 2. Streaky bilateral perihilar and basilar opacities. This could represent pneumonia, atelectasis or asymmetric edema. Electronically Signed   By: Franki Cabot M.D.   On: 12/17/2018 16:01    Marzetta Board, MD, PhD [facility]  Contact via  [email or web address]  [facility] Office Info P: [phone] F: [phone]  The patient is critically ill with multiple organ system failure and requires high complexity decision making for assessment and support, frequent evaluation and titration of therapies, advanced monitoring, review of radiographic studies and interpretation of complex data.    Critical Care Time devoted to patient care services, exclusive of separately billable procedures, described in this note is 35 minutes.

## 2018-12-18 NOTE — Progress Notes (Signed)
Plasma transfusion complete, well tolerated. Will continue to monitor.

## 2018-12-18 NOTE — Assessment & Plan Note (Signed)
Continue supplemental oxygen.  Wean as tolerated.

## 2018-12-18 NOTE — Assessment & Plan Note (Signed)
Patient is stopped his amiodarone.  He remains on Toprol-XL.  Patient need to reschedule his cardiac ablation at [facility].

## 2018-12-18 NOTE — H&P (Signed)
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  Patient states that he and his wife went on vacation during the Labor Day weekend.  They went to their beach house.  They went to [facility] and to [employer].  Patient started feeling symptoms of myalgias, headache, low-grade fever after they got back from Labor Day weekend.  Patient was scheduled for a cardiac ablation on December 12, 2018 at [facility].  Per his PCP recommendations, the patient end up getting tested for COVID-19 on December 07, 2018.  He was contacted on 11 September that his COVID test was positive.  He has been quarantined at home.  Patient's wife initially tested negative but subsequently tested positive a COVID-19.  Patient's was is asymptomatic.  Over the next 10 days, the patient symptoms of headache, episodic fevers continued.  Patient developed shortness of breath the last 2 to 3 days.  Most symptomatic when he was exerting himself.  Patient noted to be hypoxic today.  Sats are in the 80s percent range on admission to the ER.  Patient was placed on oxygen.  Chest x-ray demonstrates bilateral pneumonia.  Patient transferred to [place] for further management.  Patient was given IV Solu-Medrol in the ER.  Patient states that he feels improved while wearing supplemental oxygen.  Patient denies using supplemental oxygen at home on a regular basis.    ED Course: start on supplemental O2. Given IV Solumedrol  Review of Systems:  Review of Systems  Constitutional: Positive for fever and malaise/fatigue.       Anorexia   HENT: Negative.   Eyes: Negative.   Respiratory: Positive for cough and shortness of breath.   Cardiovascular: Negative.   Gastrointestinal: Negative.   Genitourinary: Negative.   Musculoskeletal: Positive for myalgias.  Skin: Negative.   Neurological: Negative.   Endo/Heme/Allergies: Negative.   Psychiatric/Behavioral: Negative.   All other systems reviewed and are negative.   Past Medical History:  Diagnosis Date  . GERD (gastroesophageal reflux disease)   . Hypertension   . Idiopathic cardiomyopathy ([place])   . Sleep apnea     Past Surgical History:  Procedure Laterality Date  . CARDIAC CATHETERIZATION    . COLONOSCOPY WITH PROPOFOL N/A 08/16/2016   Procedure: COLONOSCOPY WITH PROPOFOL;  Surgeon: Manya Silvas, MD;  Location[facility] ENDOSCOPY;  Service: Endoscopy;  Laterality: N/A;     reports that he has quit smoking. He has never used smokeless tobacco. He reports current alcohol use. He reports that he does not use drugs.  No Known Allergies  History reviewed. No pertinent family history.  Prior to Admission medications   Medication Sig Start Date End Date Taking? Authorizing Provider  aspirin EC 81 MG tablet Take 81 mg by mouth daily.    [provider]  fluticasone (FLONASE) 50 MCG/ACT nasal spray Place 2 sprays into both nostrils daily.    [provider]  losartan (COZAAR) 100 MG tablet Take 100 mg by mouth daily.    [provider]  metoprolol succinate (TOPROL-XL) 25 MG 24 hr tablet Take 25 mg by mouth daily.    [provider]  omeprazole (PRILOSEC) 20 MG capsule Take 20 mg by mouth 2 (two) times daily before a meal.    [provider]    Physical Exam: Vitals:   12/18/18 0100  BP: 127/85  Pulse: 60  Resp: 19  Temp: 98.2 F (36.8 C)  TempSrc: Oral  SpO2: 93%    Physical Exam  Nursing note and vitals reviewed. Constitutional: He is oriented to person, place, and time. He appears well-developed and  well-nourished. No distress.  HENT:  Head: Normocephalic and atraumatic.  Nose: Nose normal.  Eyes: Pupils are equal, round, and reactive to light. Right eye exhibits no discharge. Left eye exhibits no discharge.  Neck: No JVD present. No tracheal deviation present.  Cardiovascular: Normal rate and regular rhythm.  Respiratory: He has rales in the right middle field, the right lower field, the left middle field and the left lower field.  GI: Soft. Bowel sounds are normal. He exhibits no distension. There is no abdominal tenderness. There is no rebound.  Musculoskeletal:        General: No deformity or edema.  Lymphadenopathy:    He has no cervical adenopathy.  Neurological: He is alert and oriented to person, place, and time. He has normal reflexes.  Skin: Skin is warm and dry.  Psychiatric: He has a normal mood and affect. His behavior is normal. Judgment and thought content normal.     Labs on Admission: I have personally reviewed following labs and imaging studies  Lab Results  Component Value Date/Time   SARSCOV2NAA Detected (A) 12/07/2018 12:00 AM   CBC: Recent Labs  Lab 12/17/18 1610  WBC 7.9  NEUTROABS 6.2  HGB 14.4  HCT 40.3  MCV 90.2  PLT [id number]   Basic Metabolic Panel: Recent Labs  Lab 12/17/18 1610  NA 130*  K 3.4*  CL 89*  CO2 27  GLUCOSE 120*  BUN 16  CREATININE 0.94  CALCIUM 9.1   GFR: CrCl cannot be calculated (Unknown ideal weight.). Liver Function Tests: No results for input(s): AST, ALT, ALKPHOS, BILITOT, PROT, ALBUMIN in the last 168 hours. No results for input(s): LIPASE, AMYLASE in the last 168 hours. No results for input(s): AMMONIA in the last 168 hours. Coagulation Profile: No results for input(s): INR, PROTIME in the last 168 hours. Cardiac Enzymes: No results for input(s): CKTOTAL, CKMB, CKMBINDEX, TROPONINI in the last 168 hours. BNP (last 3 results) No results for input(s): PROBNP in the last 8760 hours. HbA1C: No results for  input(s): HGBA1C in the last 72 hours. CBG: No results for input(s): GLUCAP in the last 168 hours. Lipid Profile: No results for input(s): CHOL, HDL, LDLCALC, TRIG, CHOLHDL, LDLDIRECT in the last 72 hours. Thyroid Function Tests: No results for input(s): TSH, T4TOTAL, FREET4, T3FREE, THYROIDAB in the last 72 hours. Anemia Panel: No results for input(s): VITAMINB12, FOLATE, FERRITIN, TIBC, IRON, RETICCTPCT in the last 72 hours. Urine analysis:    Component Value Date/Time   COLORURINE AMBER BIOCHEMICALS MAY BE AFFECTED BY COLOR (A) 10/26/2006 0420   APPEARANCEUR CLOUDY (A) 10/26/2006 0420   LABSPEC 1.028 10/26/2006 0420   PHURINE 5.0 10/26/2006 0420   GLUCOSEU NEGATIVE 10/26/2006 0420   HGBUR MODERATE (A) 10/26/2006 0420   BILIRUBINUR SMALL (A) 10/26/2006 0420   KETONESUR 15 (A) 10/26/2006 0420   PROTEINUR NEGATIVE 10/26/2006 0420   UROBILINOGEN 0.2 10/26/2006 0420   NITRITE NEGATIVE 10/26/2006 0420   LEUKOCYTESUR NEGATIVE 10/26/2006 0420    Radiological Exams on Admission: I have personally reviewed  images Dg Chest Port 1 View  Result Date: 12/17/2018 CLINICAL DATA:  Dyspnea, COVID-19. EXAM: PORTABLE CHEST 1 VIEW COMPARISON:  None. FINDINGS: Borderline cardiomegaly. Streaky bilateral perihilar and bibasilar opacities. No pleural effusion or pneumothorax seen. Osseous structures about the chest are unremarkable. IMPRESSION: 1. Borderline cardiomegaly. 2. Streaky bilateral perihilar and basilar opacities. This could represent pneumonia, atelectasis or asymmetric edema. Electronically Signed   By: Franki Cabot M.D.   On: 12/17/2018 16:01      EKG: I have personally reviewed EKG: NSR    Assessment/Plan Principal Problem:   Pneumonia due to COVID-19 virus Active Problems:   Acute respiratory failure with hypoxia (HCC)   Hyponatremia   Cardiomyopathy, nonischemic (HCC)   Essential hypertension   GERD (gastroesophageal reflux disease)   Mixed hyperlipidemia   OSA on CPAP    Ventricular premature depolarization    Pneumonia due to COVID-19 virus Admit to medical bed.  Airborne precautions.  Continue steroids with Decadron 6 mg daily.  Start remdesivir.  Check daily labs including ferritin.  Acute respiratory failure with hypoxia (HCC) Continue supplemental oxygen.  Wean as tolerated.  Cardiomyopathy, nonischemic (HCC) EF of 40%.  Patient will need to re-scheduled for cardiac ablation at [facility].  Essential hypertension Continue Cozaar and Toprol-XL.  GERD (gastroesophageal reflux disease) Continue PPI.  Mixed hyperlipidemia Stable.  OSA on CPAP RT to assess.  Ventricular premature depolarization Patient is stopped his amiodarone.  He remains on Toprol-XL.  Patient need to reschedule his cardiac ablation at [facility].  Hyponatremia Check TSH, FT4   DVT prophylaxis: Lovenox Code Status: Full Code Family Communication: none  Disposition Plan: DC to home when completed Remdesivir and O2 has been weaned to RA. Consults called: none  Admission status: Inpatient, Med-Surg   Kristopher Oppenheim, DO [facility] 12/18/2018, 1:30 AM

## 2018-12-18 NOTE — TOC Initial Note (Signed)
Transition of Care [facility]) - Initial/Assessment Note    Patient Details  Name: Ricky Petersen MRN: [id number] Date of Birth: Apr 17, 1945  Transition of Care [facility]) CM/SW Contact:    Carles Collet, RN Phone Number: 12/18/2018, 10:50 AM  Clinical Narrative:             Spoke with patient over the phone. He states he live at home with his wife who is also positive but has no symptoms. He describes himself as independent PTA, he has no DME at home other than a CPAP machine that is good working order. Both he and his wife drive and she will be able to provide transportation home.  He uses [facility] on [address] and wil lplan on using their drive through service for prescriptions after DC.  His PCP is Dr Roetta Sessions at [facility] and patient states he is familiar with televisits.       Expected Discharge Plan: Home/Self Care Barriers to Discharge: Continued Medical Work up   Patient Goals and CMS Choice Patient states their goals for this hospitalization and ongoing recovery are:: to return home      Expected Discharge Plan and Services Expected Discharge Plan: Home/Self Care       Living arrangements for the past 2 months: Single Family Home                                      Prior Living Arrangements/Services Living arrangements for the past 2 months: Single Family Home Lives with:: Spouse                   Activities of Daily Living Home Assistive Devices/Equipment: CPAP ADL Screening (condition at time of admission) Patient's cognitive ability adequate to safely complete daily activities?: Yes Is the patient deaf or have difficulty hearing?: No Does the patient have difficulty seeing, even when wearing glasses/contacts?: No Does the patient have difficulty concentrating, remembering, or making decisions?: No Patient able to express need for assistance with ADLs?: Yes Does the patient have difficulty dressing or bathing?: No Independently performs ADLs?: Yes (appropriate  for developmental age) Does the patient have difficulty walking or climbing stairs?: No Weakness of Legs: None Weakness of Arms/Hands: None  Permission Sought/Granted                  Emotional Assessment              Admission diagnosis:  COVID-19 Patient Active Problem List   Diagnosis Date Noted  . Hyponatremia 12/18/2018  . Pneumonia due to COVID-19 virus 12/17/2018  . Acute respiratory failure with hypoxia ([place]) 12/17/2018  . Essential tremor 05/23/2017  . Systolic congestive heart failure ([place]) 04/28/2016  . Ventricular premature depolarization 04/28/2016  . Cardiomyopathy, nonischemic ([place]) 04/24/2015  . Mixed hyperlipidemia 02/14/2014  . Essential hypertension 08/03/2013  . GERD (gastroesophageal reflux disease) 08/03/2013  . OSA on CPAP 08/03/2013   PCP:  Tracie Harrier, MD Pharmacy:  No Pharmacies Listed    Social Determinants of Health (SDOH) Interventions    Readmission Risk Interventions No flowsheet data found.

## 2018-12-18 NOTE — Assessment & Plan Note (Addendum)
Admit to medical bed.  Airborne precautions.  Continue steroids with Decadron 6 mg daily.  Start remdesivir.  Check daily labs including ferritin.

## 2018-12-18 NOTE — Progress Notes (Addendum)
Patient currently on 15L HFNC sats are 89-91%. No s/s of pain or distress. Patient requested to have his laptop dropped of to facility so that he could complete his work, patient advised as per [facility] that he can have it dropped of but [facility] is not assuming responsibility of the device if anything occurs, patient verbalized understanding. Will continue to monitor for remainder of shift.

## 2018-12-18 NOTE — Assessment & Plan Note (Signed)
Check TSH, FT4 

## 2018-12-18 NOTE — Subjective & Objective (Signed)
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  Patient states that he and his wife went on vacation during the Labor Day weekend.  They went to their beach house.  They went to [facility] and to [employer].  Patient started feeling symptoms of myalgias, headache, low-grade fever after they got back from Labor Day weekend.  Patient was scheduled for a cardiac ablation on December 12, 2018 at [facility].  Per his PCP recommendations, the patient end up getting tested for COVID-19 on December 07, 2018.  He was contacted on 11 September that his COVID test was positive.  He has been quarantined at home.  Patient's wife initially tested negative but subsequently tested positive a COVID-19.  Patient's was is asymptomatic.  Over the next 10 days, the patient symptoms of headache, episodic fevers continued.  Patient developed shortness of breath the last 2 to 3 days.  Most symptomatic when he was exerting himself.  Patient noted to be hypoxic today.  Sats are in the 80s percent range on admission to the ER.  Patient was placed on oxygen.  Chest x-ray demonstrates bilateral pneumonia.  Patient transferred to [place] for further management.  Patient was given IV Solu-Medrol in the ER.  Patient states that he feels improved while wearing supplemental oxygen.  Patient denies using supplemental oxygen at home on a regular basis.

## 2018-12-18 NOTE — Progress Notes (Signed)
Pt is resting in bed, education on pronation given. Pt is c/o acid reflux when he lays flat or prones. He is on protonix but Dr was notified and mylanta PRN ordered.

## 2018-12-18 NOTE — Assessment & Plan Note (Signed)
EF of 40%.  Patient will need to re-scheduled for cardiac ablation at [facility].

## 2018-12-18 NOTE — Assessment & Plan Note (Signed)
RT to assess.

## 2018-12-19 DIAGNOSIS — E782 Mixed hyperlipidemia: Secondary | ICD-10-CM

## 2018-12-19 DIAGNOSIS — I493 Ventricular premature depolarization: Secondary | ICD-10-CM

## 2018-12-19 DIAGNOSIS — Z9989 Dependence on other enabling machines and devices: Secondary | ICD-10-CM

## 2018-12-19 DIAGNOSIS — G4733 Obstructive sleep apnea (adult) (pediatric): Secondary | ICD-10-CM

## 2018-12-19 LAB — COMPREHENSIVE METABOLIC PANEL
ALT: 49 U/L — ABNORMAL HIGH (ref 0–44)
AST: 51 U/L — ABNORMAL HIGH (ref 15–41)
Albumin: 3.1 g/dL — ABNORMAL LOW (ref 3.5–5.0)
Alkaline Phosphatase: 36 U/L — ABNORMAL LOW (ref 38–126)
Anion gap: 10 (ref 5–15)
BUN: 27 mg/dL — ABNORMAL HIGH (ref 8–23)
CO2: 29 mmol/L (ref 22–32)
Calcium: 9.2 mg/dL (ref 8.9–10.3)
Chloride: 94 mmol/L — ABNORMAL LOW (ref 98–111)
Creatinine, Ser: 0.95 mg/dL (ref 0.61–1.24)
GFR calc Af Amer: >60 mL/min (ref >60)
GFR calc non Af Amer: >60 mL/min (ref >60)
Glucose, Bld: 141 mg/dL — ABNORMAL HIGH (ref 70–99)
Potassium: 3.9 mmol/L (ref 3.5–5.1)
Sodium: 133 mmol/L — ABNORMAL LOW (ref 135–145)
Total Bilirubin: 0.9 mg/dL (ref 0.3–1.2)
Total Protein: 6.7 g/dL (ref 6.5–8.1)

## 2018-12-19 LAB — CBC WITH DIFFERENTIAL/PLATELET
Abs Immature Granulocytes: 0.17 10*3/uL — ABNORMAL HIGH (ref 0.00–0.07)
Basophils Absolute: 0 10*3/uL (ref 0.0–0.1)
Basophils Relative: 0 %
Eosinophils Absolute: 0 10*3/uL (ref 0.0–0.5)
Eosinophils Relative: 0 %
HCT: 39.4 % (ref 39.0–52.0)
Hemoglobin: 13.6 g/dL (ref 13.0–17.0)
Immature Granulocytes: 1 %
Lymphocytes Relative: 7 %
Lymphs Abs: 0.9 10*3/uL (ref 0.7–4.0)
MCH: 32.2 pg (ref 26.0–34.0)
MCHC: 34.5 g/dL (ref 30.0–36.0)
MCV: 93.4 fL (ref 80.0–100.0)
Monocytes Absolute: 0.7 10*3/uL (ref 0.1–1.0)
Monocytes Relative: 5 %
Neutro Abs: 11.3 10*3/uL — ABNORMAL HIGH (ref 1.7–7.7)
Neutrophils Relative %: 87 %
Platelets: 233 10*3/uL (ref 150–400)
RBC: 4.22 MIL/uL (ref 4.22–5.81)
RDW: 12.6 % (ref 11.5–15.5)
WBC: 13 10*3/uL — ABNORMAL HIGH (ref 4.0–10.5)
nRBC: 0 % (ref 0.0–0.2)

## 2018-12-19 LAB — BPAM FFP
Blood Product Expiration Date: 202009221226
ISSUE DATE / TIME: 202009211228
Unit Type and Rh: 5100

## 2018-12-19 LAB — PREPARE FRESH FROZEN PLASMA: Unit division: 0

## 2018-12-19 LAB — MAGNESIUM: Magnesium: 2.7 mg/dL — ABNORMAL HIGH (ref 1.7–2.4)

## 2018-12-19 LAB — FERRITIN: Ferritin: 952 ng/mL — ABNORMAL HIGH (ref 24–336)

## 2018-12-19 LAB — D-DIMER, QUANTITATIVE: D-Dimer, Quant: 1.58 ug/mL-FEU — ABNORMAL HIGH (ref 0.00–0.50)

## 2018-12-19 LAB — C-REACTIVE PROTEIN: CRP: 7.1 mg/dL — ABNORMAL HIGH (ref <1.0)

## 2018-12-19 MED ORDER — FUROSEMIDE 10 MG/ML IJ SOLN
40.0000 mg | Freq: Once | INTRAMUSCULAR | Status: AC
  Administered 2018-12-19: 40 mg via INTRAVENOUS
  Filled 2018-12-19: qty 4

## 2018-12-19 NOTE — Plan of Care (Signed)
POC reviewed

## 2018-12-19 NOTE — Progress Notes (Signed)
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  He was initially diagnosed with Covid 11 days ago on 12/07/2018, and was monitoring his symptoms at home.  Over the past 3 days he has been feeling short of breath and decided to come to the ER.  Chest x-ray showed bilateral infiltrates, he was hypoxic requiring initially 4 L nasal cannula and was admitted to the hospital  Significant events: 9/21 admit, rapid increase in O2 requirements 4L to 15 L overnight 9/21 Remdesevir >> plan 5 days 9/21 Steroids >> plan 10 days 9/21 Convalescent plasma, Actemra   Subjective / 24h Interval events: -Feeling about the same as yesterday, denies any shortness of breath at rest but gets pretty dyspneic with minimal activity.  Denies any chest pain, denies any fever or chills.  Assessment & Plan: Principal Problem:   Pneumonia due to COVID-19 virus Active Problems:   Cardiomyopathy, nonischemic (HCC)   Essential hypertension   GERD (gastroesophageal reflux disease)   Mixed hyperlipidemia   OSA on CPAP   Ventricular premature depolarization   Acute respiratory failure with hypoxia (HCC)   Hyponatremia   Principal Problem Acute Hypoxic Respiratory Failure due to Covid-19 Viral Illness -Patient became progressively hypoxic shortly after admission from 4 L to 15 L high flow nasal cannula during his first night. -Started on Remdesivir, steroids, received convalescent plasma as well as Actemra -the treatment plan and use of medications and known side effects were discussed with patient/family, they were clearly explained that there is no proven definitive treatment for COVID-19 infection, any medications used here are  based on published clinical articles/anecdotal data which are not peer-reviewed or randomized control trials.  Complete risks and long-term side effects are unknown, however in the best clinical judgment they seem to be of some clinical benefit rather than medical risks.  Patient agree with the treatment plan and want to receive the given medications.  Patient agreeable to Actemra.  At patient's wishes, this was discussed with his wife as well as with his brother-in-law who is a cardiologist Ricky Petersen [phone]). -Continue to closely monitor respiratory status, he is at high risk for ICU transfer and intubation -Monitor inflammatory markers, slightly improving as below  COVID-19 Labs  Recent Labs    12/18/18 0425 12/19/18 0320  DDIMER 1.60* 1.58*  FERRITIN  --  952*  CRP 13.9* 7.1*    Lab Results  Component Value Date   SARSCOV2NAA Detected (A) 12/07/2018    Active Problems Nonischemic cardiomyopathy -Previous EF 40%.  This is thought to be due to PVC burden, and he was supposed to have an ablation at [facility] on 9/15 which was canceled because of the Covid diagnosis -Appears euvolemic, will give Lasix x1 to help a #1 as well  Essential hypertension -Continue Toprol, blood pressure stable, hold Cozaar as he will be given Lasix today  GERD -Continue PPI  OSA on CPAP -Unable to use CPAP in the hospital  PVCs -Continue Toprol-XL, EKG shows sinus rhythm  Hyponatremia -Monitor, stable  Scheduled Meds: . aspirin EC  81 mg Oral Daily  . dexamethasone  6 mg Oral Q24H  . enoxaparin (LOVENOX) injection  40 mg Subcutaneous Daily  . fluticasone  2 spray Each Nare Daily  .  folic acid  1 mg Oral Daily  . losartan  100 mg Oral Daily  . metoprolol succinate  50 mg Oral Daily  . multivitamin with minerals  1 tablet Oral Daily  . pantoprazole  40 mg Oral Daily   Continuous Infusions: . remdesivir 100 mg in NS 250 mL     PRN Meds:.acetaminophen, alum & mag hydroxide-simeth,  guaiFENesin-dextromethorphan, Ipratropium-Albuterol, ondansetron **OR** ondansetron (ZOFRAN) IV, senna-docusate, traMADol  DVT prophylaxis: Lovenox Code Status: Full code Family Communication: d/w Ricky Petersen [phone] Disposition Plan: To be determined  Consultants:  None  Procedures:  None   Microbiology: None   Antimicrobials: None    Objective: Vitals:   12/18/18 1730 12/18/18 1745 12/18/18 2037 12/19/18 0431  BP:  120/67 101/61   Pulse: 70 62 63   Resp: (!) 24 17 (!) 24   Temp:  97.6 F (36.4 C) 97.9 F (36.6 C) 98.8 F (37.1 C)  TempSrc:  Oral Oral Oral  SpO2: (!) 83%  94%   Weight:      Height:        Intake/Output Summary (Last 24 hours) at 12/19/2018 0631 Last data filed at 12/19/2018 0400 Gross per 24 hour  Intake 296 ml  Output 800 ml  Net -504 ml   Filed Weights   12/18/18 0100  Weight: 105.8 kg    Examination:  Constitutional: no distress, sitting at the edge of the bed  Eyes: no scleral icterus  ENMT: mmm Neck: no masses seen  Respiratory: rhonchi bilaterally mainly at the bases, no wheezing, moves air well Cardiovascular: Regular rate and rhythm, no murmurs.  No peripheral edema Abdomen: Soft, nontender, nondistended, bowel sounds positive.  Musculoskeletal: no clubbing / cyanosis.  Skin: No rashes seen Neurologic: No focal deficits, equal strength Psychiatric: Normal judgment and insight. Alert and oriented x 3. Normal mood.    Data Reviewed: I have independently reviewed following labs and imaging studies   CBC: Recent Labs  Lab 12/17/18 1610 12/18/18 0425 12/19/18 0320  WBC 7.9 4.9 13.0*  NEUTROABS 6.2  --  11.3*  HGB 14.4 13.7 13.6  HCT 40.3 38.9* 39.4  MCV 90.2 92.4 93.4  PLT 171 180 0000000   Basic Metabolic Panel: Recent Labs  Lab 12/17/18 1610 12/18/18 0425 12/19/18 0320  NA 130* 132* 133*  K 3.4* 3.4* 3.9  CL 89* 91* 94*  CO2 27 26 29   GLUCOSE 120* 166* 141*  BUN 16 15 27*  CREATININE 0.94 0.83 0.95  CALCIUM  9.1 8.9 9.2  MG  --   --  2.7*   GFR: Estimated Creatinine Clearance: 91.1 mL/min (by C-G formula based on SCr of 0.95 mg/dL). Liver Function Tests: Recent Labs  Lab 12/18/18 0425 12/19/18 0320  AST 40 51*  ALT 35 49*  ALKPHOS 34* 36*  BILITOT 0.8 0.9  PROT 6.6 6.7  ALBUMIN 3.1* 3.1*   No results for input(s): LIPASE, AMYLASE in the last 168 hours. No results for input(s): AMMONIA in the last 168 hours. Coagulation Profile: No results for input(s): INR, PROTIME in the last 168 hours. Cardiac Enzymes: No results for input(s): CKTOTAL, CKMB, CKMBINDEX, TROPONINI in the last 168 hours. BNP (last 3 results) No results for input(s): PROBNP in the last 8760 hours. HbA1C: No results for input(s): HGBA1C in the last 72 hours. CBG: No results for input(s): GLUCAP in the last 168 hours. Lipid Profile: No results for input(s): CHOL, HDL, LDLCALC, TRIG, CHOLHDL, LDLDIRECT in the last 72 hours. Thyroid Function Tests: Recent  Labs    12/18/18 0425  TSH 0.429  FREET4 1.18*   Anemia Panel: Recent Labs    12/19/18 0320  FERRITIN 952*   Urine analysis:    Component Value Date/Time   COLORURINE AMBER BIOCHEMICALS MAY BE AFFECTED BY COLOR (A) 10/26/2006 0420   APPEARANCEUR CLOUDY (A) 10/26/2006 0420   LABSPEC 1.028 10/26/2006 0420   PHURINE 5.0 10/26/2006 0420   GLUCOSEU NEGATIVE 10/26/2006 0420   HGBUR MODERATE (A) 10/26/2006 0420   BILIRUBINUR SMALL (A) 10/26/2006 0420   KETONESUR 15 (A) 10/26/2006 0420   PROTEINUR NEGATIVE 10/26/2006 0420   UROBILINOGEN 0.2 10/26/2006 0420   NITRITE NEGATIVE 10/26/2006 0420   LEUKOCYTESUR NEGATIVE 10/26/2006 0420   Sepsis Labs: Invalid input(s): PROCALCITONIN, LACTICIDVEN  No results found for this or any previous visit (from the past 240 hour(s)).    Radiology Studies: Dg Chest Port 1 View  Result Date: 12/17/2018 CLINICAL DATA:  Dyspnea, COVID-19. EXAM: PORTABLE CHEST 1 VIEW COMPARISON:  None. FINDINGS: Borderline cardiomegaly.  Streaky bilateral perihilar and bibasilar opacities. No pleural effusion or pneumothorax seen. Osseous structures about the chest are unremarkable. IMPRESSION: 1. Borderline cardiomegaly. 2. Streaky bilateral perihilar and basilar opacities. This could represent pneumonia, atelectasis or asymmetric edema. Electronically Signed   By: Franki Cabot M.D.   On: 12/17/2018 16:01    Marzetta Board, MD, PhD [facility]  Contact via  [email or web address]  [place] P: [phone] F: [phone]

## 2018-12-19 NOTE — Progress Notes (Signed)
Pt's O2 sats have droped into the low 80's, pulse ox has been replaced, pt denies SOB and it has taken several minutes for O2 to rebound up to 91% on 15L/HFNC. His sats were as high as 97% at the beginning of shift on 12L/HFNC.

## 2018-12-20 LAB — MAGNESIUM: Magnesium: 2.6 mg/dL — ABNORMAL HIGH (ref 1.7–2.4)

## 2018-12-20 LAB — CBC WITH DIFFERENTIAL/PLATELET
Abs Immature Granulocytes: 0.11 10*3/uL — ABNORMAL HIGH (ref 0.00–0.07)
Basophils Absolute: 0 10*3/uL (ref 0.0–0.1)
Basophils Relative: 0 %
Eosinophils Absolute: 0 10*3/uL (ref 0.0–0.5)
Eosinophils Relative: 0 %
HCT: 41.4 % (ref 39.0–52.0)
Hemoglobin: 13.9 g/dL (ref 13.0–17.0)
Immature Granulocytes: 1 %
Lymphocytes Relative: 8 %
Lymphs Abs: 0.9 10*3/uL (ref 0.7–4.0)
MCH: 31.6 pg (ref 26.0–34.0)
MCHC: 33.6 g/dL (ref 30.0–36.0)
MCV: 94.1 fL (ref 80.0–100.0)
Monocytes Absolute: 0.8 10*3/uL (ref 0.1–1.0)
Monocytes Relative: 7 %
Neutro Abs: 9.6 10*3/uL — ABNORMAL HIGH (ref 1.7–7.7)
Neutrophils Relative %: 84 %
Platelets: 281 10*3/uL (ref 150–400)
RBC: 4.4 MIL/uL (ref 4.22–5.81)
RDW: 12.7 % (ref 11.5–15.5)
WBC: 11.4 10*3/uL — ABNORMAL HIGH (ref 4.0–10.5)
nRBC: 0 % (ref 0.0–0.2)

## 2018-12-20 LAB — COMPREHENSIVE METABOLIC PANEL
ALT: 47 U/L — ABNORMAL HIGH (ref 0–44)
AST: 37 U/L (ref 15–41)
Albumin: 3.1 g/dL — ABNORMAL LOW (ref 3.5–5.0)
Alkaline Phosphatase: 41 U/L (ref 38–126)
Anion gap: 10 (ref 5–15)
BUN: 29 mg/dL — ABNORMAL HIGH (ref 8–23)
CO2: 29 mmol/L (ref 22–32)
Calcium: 9.2 mg/dL (ref 8.9–10.3)
Chloride: 97 mmol/L — ABNORMAL LOW (ref 98–111)
Creatinine, Ser: 0.91 mg/dL (ref 0.61–1.24)
GFR calc Af Amer: >60 mL/min (ref >60)
GFR calc non Af Amer: >60 mL/min (ref >60)
Glucose, Bld: 105 mg/dL — ABNORMAL HIGH (ref 70–99)
Potassium: 3.9 mmol/L (ref 3.5–5.1)
Sodium: 136 mmol/L (ref 135–145)
Total Bilirubin: 0.8 mg/dL (ref 0.3–1.2)
Total Protein: 6.6 g/dL (ref 6.5–8.1)

## 2018-12-20 LAB — C-REACTIVE PROTEIN: CRP: 2.9 mg/dL — ABNORMAL HIGH (ref <1.0)

## 2018-12-20 LAB — D-DIMER, QUANTITATIVE: D-Dimer, Quant: 2.08 ug/mL-FEU — ABNORMAL HIGH (ref 0.00–0.50)

## 2018-12-20 LAB — FERRITIN: Ferritin: 884 ng/mL — ABNORMAL HIGH (ref 24–336)

## 2018-12-20 MED ORDER — HYDROCHLOROTHIAZIDE 25 MG PO TABS
25.0000 mg | ORAL_TABLET | Freq: Every day | ORAL | Status: DC
  Administered 2018-12-20 – 2018-12-23 (×4): 25 mg via ORAL
  Filled 2018-12-20 (×4): qty 1

## 2018-12-20 MED ORDER — SALINE SPRAY 0.65 % NA SOLN
1.0000 | NASAL | Status: DC | PRN
  Administered 2018-12-21: 1 via NASAL
  Filled 2018-12-20: qty 44

## 2018-12-20 MED ORDER — OXYMETAZOLINE HCL 0.05 % NA SOLN
1.0000 | Freq: Two times a day (BID) | NASAL | Status: DC
  Administered 2018-12-20 – 2018-12-23 (×7): 1 via NASAL
  Filled 2018-12-20: qty 15

## 2018-12-20 NOTE — Progress Notes (Signed)
Initial Nutrition Assessment  DOCUMENTATION CODES:   Not applicable  INTERVENTION:    Ensure Enlive po TID, each supplement provides 350 kcal and 20 grams of protein.   Continue MVI daily.   Pt receiving Hormel Shake daily with Breakfast which provides 520 kcals and 22 g of protein and Magic cup BID with lunch and dinner, each supplement provides 290 kcal and 9 grams of protein, automatically on meal trays to optimize nutritional intake.    Encourage intake of meals and supplements.  NUTRITION DIAGNOSIS:   Increased nutrient needs related to acute illness(COVID-19) as evidenced by estimated needs.  GOAL:   Patient will meet greater than or equal to 90% of their needs  MONITOR:   PO intake, Supplement acceptance  REASON FOR ASSESSMENT:   Consult Assessment of nutrition requirement/status  ASSESSMENT:   73 yo male admitted with fever and SOB related to coronavirus pneumonitis. PMH includes OSA on CPAP, HTN, CHF.   PO intake of meals is poor. Patient consumed 25% of breakfast today.    Labs reviewed. BUN 29 (H), magnesium 2.6 (H) Albumin 3.1 (L), reflects acute inflammatory process, not a good indicator of nutrition status.  Medications reviewed and include decadron, folic acid, MVI.  NUTRITION - FOCUSED PHYSICAL EXAM:  deferred  Diet Order:   Diet Order            Diet Heart Room service appropriate? Yes; Fluid consistency: Thin  Diet effective now              EDUCATION NEEDS:   Not appropriate for education at this time  Skin:  Skin Assessment: Reviewed RN Assessment  Last BM:  no BM documented  Height:   Ht Readings from Last 1 Encounters:  12/18/18 6\' 4"  (1.93 m)    Weight:   Wt Readings from Last 1 Encounters:  12/18/18 105.8 kg    Ideal Body Weight:  91.8 kg  BMI:  Body mass index is 28.39 kg/m.  Estimated Nutritional Needs:   Kcal:  [id number]  Protein:  130-160 gm  Fluid:  2.3-2.5 L    Molli Barrows, RD, LDN,  [place] Pager [phone] After Hours Pager [phone]

## 2018-12-20 NOTE — Progress Notes (Signed)
PROGRESS NOTE    Ricky Petersen  [id number] DOB: 02-01-1946 DOA: 12/18/2018 PCP: Ricky Harrier, MD      Brief Narrative:  Ricky Petersen is a 73 y.o. M with OSA on CPAP, HTN, sCHF EF 40-45% who presented with 2 weeks malaise, flu-like symptoms, and fever progressing to SOB.  In the ER, CXR showed multifocal pneumonia and SpO2 80s.       Assessment & Plan:  Coronavirus pneumonitis with acute hypoxic respiratory failure In the setting of the ongoing 2020 COVID-19 pandemic.  Given plasma on 9/21 Got Actemra 9/21  Stable on 15L -Continue remdesivir, day 3 -Continue Decadron, day 4 -VTE PPx with Lovenox  -Flutter valve, turn, cough, incentive spirometry q2hrs while awake -Nutrition consult     OSA -Continue O2 at night  Hypertension BP controlled -Continue HCTZ, losartan, metoprolol  Chronic systolic CHF 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  No longer on amiodarone. -Continue aspirin -Continue losartan, metoprolol  Other medications -Continue pantoprazole  Hyponatremia Resolved        MDM and disposition: The below labs and imaging reports were reviewed and summarized above.  Medication management as above.  The patient was admitted with COVID-19.  He has severe respiratory failure, with desaturations <80% off O2 and requiring 15L high flow nasal cannula.     DVT prophylaxis: Lovenox Code Status: FULL Family Communication: Will call wife by phone     Procedures:   CXR 9/20 - bilateral pneumonia  Antimicrobials:   Azithromycin x1        Subjective: No dyspnea or chest tightness at rest.  Still cough.  No confusion no fever.  No vomiting.    Objective: Vitals:   12/20/18 0835 12/20/18 0956 12/20/18 0957 12/20/18 1010  BP: 111/66 106/64    Pulse: (!) 58  75   Resp: 20     Temp: 97.6 F (36.4 C)     TempSrc: Oral     SpO2: 100%   99%  Weight:      Height:        Intake/Output Summary (Last 24 hours) at 12/20/2018  1220 Last data filed at 12/20/2018 1010 Gross per 24 hour  Intake 480 ml  Output 800 ml  Net -320 ml   Filed Weights   12/18/18 0100  Weight: 105.8 kg    Examination: General appearance: Thin adult male, alert and in no obvious distress.   HEENT: Anicteric, conjunctiva pink, lids and lashes normal. No nasal deformity, discharge, epistaxis.  Lips moist, dentition good.  OP dry, no oral lesions, Hearing normal.   Skin: Warm and dry.  No jaundice.  No suspicious rashes or lesions. Cardiac: RRR, nl S1-S2, no murmurs appreciated.  Capillary refill is brisk.  JVP normal.  No LE edema.  Radial pulses 2+ and symmetric. Respiratory: Tachypneic, no wheezing. Abdomen: Abdomen soft.  No TTP, or guaridng. No ascites, distension, hepatosplenomegaly.   MSK: No deformities or effusions. Neuro: Awake and alert.  EOMI, moves all extremities. Speech fluent.    Psych: Sensorium intact and responding to questions, attention normal. Affect normal.  Judgment and insight appear normal.      Data Reviewed: I have personally reviewed following labs and imaging studies:  CBC: Recent Labs  Lab 12/17/18 1610 12/18/18 0425 12/19/18 0320 12/20/18 0430  WBC 7.9 4.9 13.0* 11.4*  NEUTROABS 6.2  --  11.3* 9.6*  HGB 14.4 13.7 13.6 13.9  HCT 40.3 38.9* 39.4 41.4  MCV 90.2 92.4 93.4 94.1  PLT 171  180 233 [id number]   Basic Metabolic Panel: Recent Labs  Lab 12/17/18 1610 12/18/18 0425 12/19/18 0320 12/20/18 0430  NA 130* 132* 133* 136  K 3.4* 3.4* 3.9 3.9  CL 89* 91* 94* 97*  CO2 27 26 29 29   GLUCOSE 120* 166* 141* 105*  BUN 16 15 27* 29*  CREATININE 0.94 0.83 0.95 0.91  CALCIUM 9.1 8.9 9.2 9.2  MG  --   --  2.7* 2.6*   GFR: Estimated Creatinine Clearance: 95.1 mL/min (by C-G formula based on SCr of 0.91 mg/dL). Liver Function Tests: Recent Labs  Lab 12/18/18 0425 12/19/18 0320 12/20/18 0430  AST 40 51* 37  ALT 35 49* 47*  ALKPHOS 34* 36* 41  BILITOT 0.8 0.9 0.8  PROT 6.6 6.7 6.6  ALBUMIN  3.1* 3.1* 3.1*   No results for input(s): LIPASE, AMYLASE in the last 168 hours. No results for input(s): AMMONIA in the last 168 hours. Coagulation Profile: No results for input(s): INR, PROTIME in the last 168 hours. Cardiac Enzymes: No results for input(s): CKTOTAL, CKMB, CKMBINDEX, TROPONINI in the last 168 hours. BNP (last 3 results) No results for input(s): PROBNP in the last 8760 hours. HbA1C: No results for input(s): HGBA1C in the last 72 hours. CBG: No results for input(s): GLUCAP in the last 168 hours. Lipid Profile: No results for input(s): CHOL, HDL, LDLCALC, TRIG, CHOLHDL, LDLDIRECT in the last 72 hours. Thyroid Function Tests: Recent Labs    12/18/18 0425  TSH 0.429  FREET4 1.18*   Anemia Panel: Recent Labs    12/19/18 0320 12/20/18 0430  FERRITIN 952* 884*   Urine analysis:    Component Value Date/Time   COLORURINE AMBER BIOCHEMICALS MAY BE AFFECTED BY COLOR (A) 10/26/2006 0420   APPEARANCEUR CLOUDY (A) 10/26/2006 0420   LABSPEC 1.028 10/26/2006 0420   PHURINE 5.0 10/26/2006 0420   GLUCOSEU NEGATIVE 10/26/2006 0420   HGBUR MODERATE (A) 10/26/2006 0420   BILIRUBINUR SMALL (A) 10/26/2006 0420   KETONESUR 15 (A) 10/26/2006 0420   PROTEINUR NEGATIVE 10/26/2006 0420   UROBILINOGEN 0.2 10/26/2006 0420   NITRITE NEGATIVE 10/26/2006 0420   LEUKOCYTESUR NEGATIVE 10/26/2006 0420   Sepsis Labs: @LABRCNTIP (procalcitonin:4,lacticacidven:4)  )No results found for this or any previous visit (from the past 240 hour(s)).       Radiology Studies: No results found.      Scheduled Meds: . aspirin EC  81 mg Oral Daily  . dexamethasone  6 mg Oral Q24H  . enoxaparin (LOVENOX) injection  40 mg Subcutaneous Daily  . fluticasone  2 spray Each Nare Daily  . folic acid  1 mg Oral Daily  . hydrochlorothiazide  25 mg Oral Daily  . losartan  100 mg Oral Daily  . metoprolol succinate  50 mg Oral Daily  . multivitamin with minerals  1 tablet Oral Daily  .  oxymetazoline  1 spray Each Nare BID  . pantoprazole  40 mg Oral Daily   Continuous Infusions: . remdesivir 100 mg in NS 250 mL 100 mg (12/20/18 1010)     LOS: 2 days    Time spent: 35 minutes      Ricky Dada, MD [facility] 12/20/2018, 12:20 PM     Please page through [place]:  [email or web address] Contact charge nurse for password If 7PM-7AM, please contact night-coverage

## 2018-12-20 NOTE — Progress Notes (Signed)
Wife updated

## 2018-12-21 LAB — COMPREHENSIVE METABOLIC PANEL
ALT: 51 U/L — ABNORMAL HIGH (ref 0–44)
AST: 38 U/L (ref 15–41)
Albumin: 3.1 g/dL — ABNORMAL LOW (ref 3.5–5.0)
Alkaline Phosphatase: 43 U/L (ref 38–126)
Anion gap: 11 (ref 5–15)
BUN: 22 mg/dL (ref 8–23)
CO2: 27 mmol/L (ref 22–32)
Calcium: 9.1 mg/dL (ref 8.9–10.3)
Chloride: 97 mmol/L — ABNORMAL LOW (ref 98–111)
Creatinine, Ser: 0.81 mg/dL (ref 0.61–1.24)
GFR calc Af Amer: >60 mL/min (ref >60)
GFR calc non Af Amer: >60 mL/min (ref >60)
Glucose, Bld: 107 mg/dL — ABNORMAL HIGH (ref 70–99)
Potassium: 4.1 mmol/L (ref 3.5–5.1)
Sodium: 135 mmol/L (ref 135–145)
Total Bilirubin: 1 mg/dL (ref 0.3–1.2)
Total Protein: 6.2 g/dL — ABNORMAL LOW (ref 6.5–8.1)

## 2018-12-21 LAB — CBC WITH DIFFERENTIAL/PLATELET
Abs Immature Granulocytes: 0.17 10*3/uL — ABNORMAL HIGH (ref 0.00–0.07)
Basophils Absolute: 0 10*3/uL (ref 0.0–0.1)
Basophils Relative: 0 %
Eosinophils Absolute: 0 10*3/uL (ref 0.0–0.5)
Eosinophils Relative: 0 %
HCT: 40.9 % (ref 39.0–52.0)
Hemoglobin: 14.3 g/dL (ref 13.0–17.0)
Immature Granulocytes: 2 %
Lymphocytes Relative: 11 %
Lymphs Abs: 1.1 10*3/uL (ref 0.7–4.0)
MCH: 32.6 pg (ref 26.0–34.0)
MCHC: 35 g/dL (ref 30.0–36.0)
MCV: 93.2 fL (ref 80.0–100.0)
Monocytes Absolute: 0.9 10*3/uL (ref 0.1–1.0)
Monocytes Relative: 8 %
Neutro Abs: 7.9 10*3/uL — ABNORMAL HIGH (ref 1.7–7.7)
Neutrophils Relative %: 79 %
Platelets: 277 10*3/uL (ref 150–400)
RBC: 4.39 MIL/uL (ref 4.22–5.81)
RDW: 12.6 % (ref 11.5–15.5)
WBC: 10.1 10*3/uL (ref 4.0–10.5)
nRBC: 0 % (ref 0.0–0.2)

## 2018-12-21 LAB — D-DIMER, QUANTITATIVE: D-Dimer, Quant: 2.3 ug/mL-FEU — ABNORMAL HIGH (ref 0.00–0.50)

## 2018-12-21 NOTE — Progress Notes (Signed)
PROGRESS NOTE    Ricky Petersen  [id number] DOB: 08/04/1945 DOA: 12/18/2018 PCP: Tracie Harrier, MD      Brief Narrative:  Mr. Schoepp is a 73 y.o. M with OSA on CPAP, HTN, sCHF EF 40-45% who presented with 2 weeks malaise, flu-like symptoms, and fever progressing to SOB. In the ER, CXR showed multifocal pneumonia and SpO2 80s.   Assessment & Plan:  Coronavirus pneumonitis with acute hypoxic respiratory failure Given plasma on 9/21 Got Actemra 9/21 Patient able to wean down to 8 L high flow nasal cannula today, previously 15 L high flow nasal cannula  - Continue Remdesivir, stop date 12/22/2018  - Continue Decadron 10-day course per protocol  - VTE PPx with Lovenox  - Flutter valve, continue proning, cough, incentive spirometry q2hrs while awake Recent Labs    12/19/18 0320 12/20/18 0430 12/21/18 0340  DDIMER 1.58* 2.08* 2.30*  FERRITIN 952* 884*  --   CRP 7.1* 2.9*  --     OSA -Continue O2 at night  Hypertension BP controlled -Continue HCTZ, losartan, metoprolol  Chronic systolic CHF 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  No longer on amiodarone. -Continue aspirin -Continue losartan, metoprolol  Other medications -Continue pantoprazole  Hyponatremia, likely hypovolemic Resolved  MDM and disposition: The below labs and imaging reports were reviewed and summarized above.  Medication management as above. The patient was admitted with COVID-19.  He has severe respiratory failure, with desaturations <80% off O2 and requiring 15L high flow nasal cannula.    DVT prophylaxis: Lovenox Code Status: FULL  Procedures:   CXR 9/20 - bilateral pneumonia  Antimicrobials:   Azithromycin x1   Subjective: No acute issues or events overnight, continues to remark on dyspnea, worse with exertion, denies chest pain, nausea, vomiting, diarrhea, constipation, headache, fevers, chills.  Objective: Vitals:   12/20/18 1500 12/20/18 1600 12/20/18 2001 12/21/18  0400  BP:   123/72 135/83  Pulse:  60    Resp:  (!) 22 18 (!) 22  Temp: 97.9 F (36.6 C) 97.9 F (36.6 C) 97.6 F (36.4 C) 98.4 F (36.9 C)  TempSrc:  Axillary Oral Oral  SpO2:  98%  (!) 89%  Weight:      Height:        Intake/Output Summary (Last 24 hours) at 12/21/2018 0830 Last data filed at 12/21/2018 0700 Gross per 24 hour  Intake 1090 ml  Output 2850 ml  Net -1760 ml   Filed Weights   12/18/18 0100  Weight: 105.8 kg    Examination:  General appearance: Thin adult male, alert and in no obvious distress.   HEENT: Anicteric, conjunctiva pink, lids and lashes normal. No nasal deformity, discharge, epistaxis.  Lips moist, dentition good.  OP dry, no oral lesions, Hearing normal.   Skin: Warm and dry.  No jaundice.  Without rash or lesion Cardiac: RRR, nl S1-S2, no murmurs appreciated.  Capillary refill is brisk.  JVP normal.  No LE edema.  Radial pulses 2+ and symmetric. Respiratory: Tachypneic, no wheezing. Abdomen: Abdomen soft.  No TTP, or guaridng. No ascites, distension, hepatosplenomegaly.   MSK: No deformities or effusions. Neuro: Awake and alert.  EOMI, moves all extremities. Speech fluent.     Data Reviewed: I have personally reviewed following labs and imaging studies:  CBC: Recent Labs  Lab 12/17/18 1610 12/18/18 0425 12/19/18 0320 12/20/18 0430 12/21/18 0340  WBC 7.9 4.9 13.0* 11.4* 10.1  NEUTROABS 6.2  --  11.3* 9.6* 7.9*  HGB 14.4 13.7 13.6  13.9 14.3  HCT 40.3 38.9* 39.4 41.4 40.9  MCV 90.2 92.4 93.4 94.1 93.2  PLT 171 180 233 281 [id number]   Basic Metabolic Panel: Recent Labs  Lab 12/17/18 1610 12/18/18 0425 12/19/18 0320 12/20/18 0430 12/21/18 0340  NA 130* 132* 133* 136 135  K 3.4* 3.4* 3.9 3.9 4.1  CL 89* 91* 94* 97* 97*  CO2 27 26 29 29 27   GLUCOSE 120* 166* 141* 105* 107*  BUN 16 15 27* 29* 22  CREATININE 0.94 0.83 0.95 0.91 0.81  CALCIUM 9.1 8.9 9.2 9.2 9.1  MG  --   --  2.7* 2.6*  --    GFR: Estimated Creatinine Clearance: 106.8  mL/min (by C-G formula based on SCr of 0.81 mg/dL).   Liver Function Tests: Recent Labs  Lab 12/18/18 0425 12/19/18 0320 12/20/18 0430 12/21/18 0340  AST 40 51* 37 38  ALT 35 49* 47* 51*  ALKPHOS 34* 36* 41 43  BILITOT 0.8 0.9 0.8 1.0  PROT 6.6 6.7 6.6 6.2*  ALBUMIN 3.1* 3.1* 3.1* 3.1*   Anemia Panel: Recent Labs    12/19/18 0320 12/20/18 0430  FERRITIN 952* 884*   Urine analysis:    Component Value Date/Time   COLORURINE AMBER BIOCHEMICALS MAY BE AFFECTED BY COLOR (A) 10/26/2006 0420   APPEARANCEUR CLOUDY (A) 10/26/2006 0420   LABSPEC 1.028 10/26/2006 0420   PHURINE 5.0 10/26/2006 0420   GLUCOSEU NEGATIVE 10/26/2006 0420   HGBUR MODERATE (A) 10/26/2006 0420   BILIRUBINUR SMALL (A) 10/26/2006 0420   KETONESUR 15 (A) 10/26/2006 0420   PROTEINUR NEGATIVE 10/26/2006 0420   UROBILINOGEN 0.2 10/26/2006 0420   NITRITE NEGATIVE 10/26/2006 0420   LEUKOCYTESUR NEGATIVE 10/26/2006 0420   Radiology Studies: No results found.  Scheduled Meds: . aspirin EC  81 mg Oral Daily  . dexamethasone  6 mg Oral Q24H  . enoxaparin (LOVENOX) injection  40 mg Subcutaneous Daily  . fluticasone  2 spray Each Nare Daily  . folic acid  1 mg Oral Daily  . hydrochlorothiazide  25 mg Oral Daily  . losartan  100 mg Oral Daily  . metoprolol succinate  50 mg Oral Daily  . multivitamin with minerals  1 tablet Oral Daily  . oxymetazoline  1 spray Each Nare BID  . pantoprazole  40 mg Oral Daily   Continuous Infusions: . remdesivir 100 mg in NS 250 mL Stopped (12/20/18 1040)     LOS: 3 days    Time spent: 35 minutes   Little Ishikawa, DO [facility] 12/21/2018, 8:30 AM   Please page through [place]:  [email or web address] Contact charge nurse for password If 7PM-7AM, please contact night-coverage

## 2018-12-21 NOTE — Progress Notes (Signed)
Patient spoke with wife this evening, stated she had already gone to bed and did not need to be called and updated.

## 2018-12-21 NOTE — Progress Notes (Signed)
0700 received bedside report 0800 assessment completed 1000 morning meds given 1200 ambulated pt in room 1400 pt had a bm 1600 changed pt bed sheets and cleaned room 1800 emptied urinal

## 2018-12-22 LAB — COMPREHENSIVE METABOLIC PANEL
ALT: 50 U/L — ABNORMAL HIGH (ref 0–44)
AST: 32 U/L (ref 15–41)
Albumin: 3.3 g/dL — ABNORMAL LOW (ref 3.5–5.0)
Alkaline Phosphatase: 43 U/L (ref 38–126)
Anion gap: 9 (ref 5–15)
BUN: 20 mg/dL (ref 8–23)
CO2: 29 mmol/L (ref 22–32)
Calcium: 9.6 mg/dL (ref 8.9–10.3)
Chloride: 99 mmol/L (ref 98–111)
Creatinine, Ser: 0.86 mg/dL (ref 0.61–1.24)
GFR calc Af Amer: >60 mL/min (ref >60)
GFR calc non Af Amer: >60 mL/min (ref >60)
Glucose, Bld: 107 mg/dL — ABNORMAL HIGH (ref 70–99)
Potassium: 4.6 mmol/L (ref 3.5–5.1)
Sodium: 137 mmol/L (ref 135–145)
Total Bilirubin: 1 mg/dL (ref 0.3–1.2)
Total Protein: 6.5 g/dL (ref 6.5–8.1)

## 2018-12-22 LAB — CBC WITH DIFFERENTIAL/PLATELET
Abs Immature Granulocytes: 0.62 10*3/uL — ABNORMAL HIGH (ref 0.00–0.07)
Basophils Absolute: 0.1 10*3/uL (ref 0.0–0.1)
Basophils Relative: 1 %
Eosinophils Absolute: 0 10*3/uL (ref 0.0–0.5)
Eosinophils Relative: 0 %
HCT: 44.8 % (ref 39.0–52.0)
Hemoglobin: 15.3 g/dL (ref 13.0–17.0)
Immature Granulocytes: 5 %
Lymphocytes Relative: 15 %
Lymphs Abs: 1.7 10*3/uL (ref 0.7–4.0)
MCH: 32 pg (ref 26.0–34.0)
MCHC: 34.2 g/dL (ref 30.0–36.0)
MCV: 93.7 fL (ref 80.0–100.0)
Monocytes Absolute: 1.1 10*3/uL — ABNORMAL HIGH (ref 0.1–1.0)
Monocytes Relative: 9 %
Neutro Abs: 8.2 10*3/uL — ABNORMAL HIGH (ref 1.7–7.7)
Neutrophils Relative %: 70 %
Platelets: 327 10*3/uL (ref 150–400)
RBC: 4.78 MIL/uL (ref 4.22–5.81)
RDW: 12.6 % (ref 11.5–15.5)
WBC: 11.7 10*3/uL — ABNORMAL HIGH (ref 4.0–10.5)
nRBC: 0.3 % — ABNORMAL HIGH (ref 0.0–0.2)

## 2018-12-22 MED ORDER — ENSURE ENLIVE PO LIQD
237.0000 mL | Freq: Three times a day (TID) | ORAL | Status: DC
  Administered 2018-12-22 – 2018-12-23 (×3): 237 mL via ORAL

## 2018-12-22 NOTE — Progress Notes (Signed)
PROGRESS NOTE    Ricky Petersen  [id number] DOB: November 24, 1945 DOA: 12/18/2018 PCP: Ricky Harrier, MD      Brief Narrative:  Ricky Petersen is a 73 y.o. M with OSA on CPAP, HTN, sCHF EF 40-45% who presented with 2 weeks malaise, flu-like symptoms, and fever progressing to SOB. In the ER, CXR showed multifocal pneumonia and SpO2 80s.   Assessment & Plan:   Coronavirus pneumonitis with acute hypoxic respiratory failure Given plasma on 9/21 Got Actemra 9/21 Patient able to wean down to 3L high flow nasal cannula today, previously 15 L high flow nasal cannula  - Continue Remdesivir, stop date 12/22/2018  - Continue Decadron 10-day course per protocol  - VTE PPx with Lovenox  - Flutter valve, continue proning, incentive spirometry q2hrs while awake Recent Labs    12/20/18 0430 12/21/18 0340  DDIMER 2.08* 2.30*  FERRITIN 884*  --   CRP 2.9*  --     OSA -Continue O2 at night  Hypertension BP controlled -Continue HCTZ, losartan, metoprolol  Chronic systolic CHF 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  No longer on amiodarone. -Continue aspirin -Continue losartan, metoprolol  Other medications -Continue pantoprazole  Hyponatremia, likely hypovolemic Resolved  MDM and disposition: The below labs and imaging reports were reviewed and summarized above.  Medication management as above. The patient was admitted with COVID-19.  He has severe respiratory failure, with desaturations <90% off O2 and requiring supplemental oxygen.  DVT prophylaxis: Lovenox Code Status: FULL  Procedures:   CXR 9/20 - bilateral pneumonia  Antimicrobials:   Azithromycin x1   Subjective: No acute issues or events overnight, continues to remark on dyspnea, worse with exertion, denies chest pain, nausea, vomiting, diarrhea, constipation, headache, fevers, chills.  Objective: Vitals:   12/21/18 1632 12/21/18 2105 12/22/18 0404 12/22/18 0800  BP: 127/88 (!) 130/96 125/78 128/88   Pulse: 74 (!) 58 (!) 52 (!) 54  Resp: 20   (!) 22  Temp: 98 F (36.7 C) 98.2 F (36.8 C) 97.8 F (36.6 C) 97.9 F (36.6 C)  TempSrc: Oral Oral Oral Oral  SpO2: 93%  94% 95%  Weight:      Height:        Intake/Output Summary (Last 24 hours) at 12/22/2018 0854 Last data filed at 12/22/2018 0404 Gross per 24 hour  Intake 340 ml  Output 1525 ml  Net -1185 ml   Filed Weights   12/18/18 0100  Weight: 105.8 kg    Examination:  General appearance: Thin adult male, alert and in no obvious distress.   HEENT: Anicteric, conjunctiva pink, lids and lashes normal. No nasal deformity, discharge, epistaxis.  Lips moist, dentition good.  OP dry, no oral lesions, Hearing normal.   Skin: Warm and dry.  No jaundice.  Without rash or lesion Cardiac: RRR, nl S1-S2, no murmurs appreciated.  Capillary refill is brisk.  JVP normal.  No LE edema.  Radial pulses 2+ and symmetric. Respiratory: Tachypneic, no wheezing. Abdomen: Abdomen soft.  No TTP, or guaridng. No ascites, distension, hepatosplenomegaly.   MSK: No deformities or effusions. Neuro: Awake and alert.  EOMI, moves all extremities. Speech fluent.     Data Reviewed: I have personally reviewed following labs and imaging studies:  CBC: Recent Labs  Lab 12/17/18 1610 12/18/18 0425 12/19/18 0320 12/20/18 0430 12/21/18 0340 12/22/18 0415  WBC 7.9 4.9 13.0* 11.4* 10.1 11.7*  NEUTROABS 6.2  --  11.3* 9.6* 7.9* 8.2*  HGB 14.4 13.7 13.6 13.9 14.3 15.3  HCT  40.3 38.9* 39.4 41.4 40.9 44.8  MCV 90.2 92.4 93.4 94.1 93.2 93.7  PLT 171 180 233 281 277 [id number]   Basic Metabolic Panel: Recent Labs  Lab 12/18/18 0425 12/19/18 0320 12/20/18 0430 12/21/18 0340 12/22/18 0415  NA 132* 133* 136 135 137  K 3.4* 3.9 3.9 4.1 4.6  CL 91* 94* 97* 97* 99  CO2 26 29 29 27 29   GLUCOSE 166* 141* 105* 107* 107*  BUN 15 27* 29* 22 20  CREATININE 0.83 0.95 0.91 0.81 0.86  CALCIUM 8.9 9.2 9.2 9.1 9.6  MG  --  2.7* 2.6*  --   --    GFR: Estimated  Creatinine Clearance: 100.6 mL/min (by C-G formula based on SCr of 0.86 mg/dL).   Liver Function Tests: Recent Labs  Lab 12/18/18 0425 12/19/18 0320 12/20/18 0430 12/21/18 0340 12/22/18 0415  AST 40 51* 37 38 32  ALT 35 49* 47* 51* 50*  ALKPHOS 34* 36* 41 43 43  BILITOT 0.8 0.9 0.8 1.0 1.0  PROT 6.6 6.7 6.6 6.2* 6.5  ALBUMIN 3.1* 3.1* 3.1* 3.1* 3.3*   Anemia Panel: Recent Labs    12/20/18 0430  FERRITIN 884*   Urine analysis:    Component Value Date/Time   COLORURINE AMBER BIOCHEMICALS MAY BE AFFECTED BY COLOR (A) 10/26/2006 0420   APPEARANCEUR CLOUDY (A) 10/26/2006 0420   LABSPEC 1.028 10/26/2006 0420   PHURINE 5.0 10/26/2006 0420   GLUCOSEU NEGATIVE 10/26/2006 0420   HGBUR MODERATE (A) 10/26/2006 0420   BILIRUBINUR SMALL (A) 10/26/2006 0420   KETONESUR 15 (A) 10/26/2006 0420   PROTEINUR NEGATIVE 10/26/2006 0420   UROBILINOGEN 0.2 10/26/2006 0420   NITRITE NEGATIVE 10/26/2006 0420   LEUKOCYTESUR NEGATIVE 10/26/2006 0420   Radiology Studies: No results found.  Scheduled Meds: . aspirin EC  81 mg Oral Daily  . dexamethasone  6 mg Oral Q24H  . enoxaparin (LOVENOX) injection  40 mg Subcutaneous Daily  . fluticasone  2 spray Each Nare Daily  . folic acid  1 mg Oral Daily  . hydrochlorothiazide  25 mg Oral Daily  . losartan  100 mg Oral Daily  . metoprolol succinate  50 mg Oral Daily  . multivitamin with minerals  1 tablet Oral Daily  . oxymetazoline  1 spray Each Nare BID  . pantoprazole  40 mg Oral Daily   Continuous Infusions: . remdesivir 100 mg in NS 250 mL Stopped (12/21/18 1033)     LOS: 4 days    Time spent: 35 minutes   Ricky Ishikawa, DO [facility] 12/22/2018, 8:54 AM   Please page through [place]:  [email or web address] Contact charge nurse for password If 7PM-7AM, please contact night-coverage

## 2018-12-22 NOTE — Progress Notes (Signed)
0700 bedside report received, board updated 0800 assessment and vs completed and charted 0900 emptied urinal 1000 gave morning meds 1100 emptied urinal, completed walk test 1300 gave fresh water and phone in the room 1500 gave ensure 1700 emptied urinal and vs where taken and charted

## 2018-12-22 NOTE — Progress Notes (Signed)
1100 completed walk test. Patient was able to ambulate around the whole floor and room air sats staying at 93%. Patient briefly desated to 88% but was able to recover quickly on his own.

## 2018-12-22 NOTE — TOC Progression Note (Signed)
Transition of Care [facility]) - Progression Note    Patient Details  Name: Ricky Petersen MRN: [id number] Date of Birth: 1945-11-28  Transition of Care [facility]) CM/SW Contact  Loletha Grayer Beverely Pace, RN Phone Number: [phone] (working remotely) 12/22/2018, 2:06 PM  Clinical Narrative:   Case manager continues to follow patient for discharge needs. He is now on 8L [place].      Expected Discharge Plan: Home/Self Care Barriers to Discharge: Continued Medical Work up  Expected Discharge Plan and Services Expected Discharge Plan: Home/Self Care       Living arrangements for the past 2 months: Single Family Home                                       Social Determinants of Health (SDOH) Interventions    Readmission Risk Interventions No flowsheet data found.

## 2018-12-22 NOTE — Progress Notes (Signed)
Spoke with wife Oleta Mouse.  All questions and concerns answered.

## 2018-12-23 LAB — CBC WITH DIFFERENTIAL/PLATELET
Abs Immature Granulocytes: 1.01 10*3/uL — ABNORMAL HIGH (ref 0.00–0.07)
Basophils Absolute: 0.1 10*3/uL (ref 0.0–0.1)
Basophils Relative: 1 %
Eosinophils Absolute: 0.1 10*3/uL (ref 0.0–0.5)
Eosinophils Relative: 1 %
HCT: 44.5 % (ref 39.0–52.0)
Hemoglobin: 15 g/dL (ref 13.0–17.0)
Immature Granulocytes: 8 %
Lymphocytes Relative: 13 %
Lymphs Abs: 1.7 10*3/uL (ref 0.7–4.0)
MCH: 31.6 pg (ref 26.0–34.0)
MCHC: 33.7 g/dL (ref 30.0–36.0)
MCV: 93.9 fL (ref 80.0–100.0)
Monocytes Absolute: 1.1 10*3/uL — ABNORMAL HIGH (ref 0.1–1.0)
Monocytes Relative: 9 %
Neutro Abs: 8.7 10*3/uL — ABNORMAL HIGH (ref 1.7–7.7)
Neutrophils Relative %: 68 %
Platelets: 362 10*3/uL (ref 150–400)
RBC: 4.74 MIL/uL (ref 4.22–5.81)
RDW: 12.8 % (ref 11.5–15.5)
WBC: 12.6 10*3/uL — ABNORMAL HIGH (ref 4.0–10.5)
nRBC: 0.4 % — ABNORMAL HIGH (ref 0.0–0.2)

## 2018-12-23 LAB — COMPREHENSIVE METABOLIC PANEL
ALT: 47 U/L — ABNORMAL HIGH (ref 0–44)
AST: 30 U/L (ref 15–41)
Albumin: 3.3 g/dL — ABNORMAL LOW (ref 3.5–5.0)
Alkaline Phosphatase: 45 U/L (ref 38–126)
Anion gap: 12 (ref 5–15)
BUN: 24 mg/dL — ABNORMAL HIGH (ref 8–23)
CO2: 24 mmol/L (ref 22–32)
Calcium: 9.4 mg/dL (ref 8.9–10.3)
Chloride: 99 mmol/L (ref 98–111)
Creatinine, Ser: 0.89 mg/dL (ref 0.61–1.24)
GFR calc Af Amer: >60 mL/min (ref >60)
GFR calc non Af Amer: >60 mL/min (ref >60)
Glucose, Bld: 102 mg/dL — ABNORMAL HIGH (ref 70–99)
Potassium: 4.4 mmol/L (ref 3.5–5.1)
Sodium: 135 mmol/L (ref 135–145)
Total Bilirubin: 1.1 mg/dL (ref 0.3–1.2)
Total Protein: 6.4 g/dL — ABNORMAL LOW (ref 6.5–8.1)

## 2018-12-23 LAB — D-DIMER, QUANTITATIVE: D-Dimer, Quant: 2.44 ug/mL-FEU — ABNORMAL HIGH (ref 0.00–0.50)

## 2018-12-23 MED ORDER — PREDNISONE 10 MG PO TABS: ORAL_TABLET | ORAL | 0 refills | Status: DC

## 2018-12-23 NOTE — Discharge Summary (Signed)
Physician Discharge Summary  Ricky Petersen [id number] DOB: December 19, 1945 DOA: 12/18/2018  PCP: Tracie Harrier, MD  Admit date: 12/18/2018 Discharge date: 12/23/2018  Admitted From: Home Disposition: Home  Recommendations for Outpatient Follow-up:  1. Follow up with PCP in 1-2 weeks 2. 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  Patient states that he and his wife went on vacation during the Labor Day weekend.  They went to their beach house.  They went to [facility] and to [employer].  Patient started feeling symptoms of myalgias, headache, low-grade fever after they got back from Labor Day weekend.  Patient was scheduled for a cardiac ablation on December 12, 2018 at [facility].  Per his PCP recommendations, the patient end up getting tested for COVID-19 on December 07, 2018.  He was contacted on 11 September that his COVID test was positive.  He has been quarantined at home.  Patient's wife initially tested negative but subsequently tested positive a COVID-19.  Patient's was is asymptomatic.  Over the next 10 days, the patient symptoms of headache, episodic fevers continued.  Patient developed shortness of breath the last 2 to 3 days.  Most symptomatic when he was exerting himself.  Patient noted to be hypoxic today.  Sats are in the 80s percent range on admission to the ER.  Patient was placed on oxygen.  Chest x-ray demonstrates bilateral pneumonia.  Patient transferred to [place] for further management.  Patient was given IV Solu-Medrol in the ER.  Patient states that he feels improved while wearing supplemental oxygen.  Patient admitted to [facility] given above hypoxia, COVID-19  infection and need for IV steroids and Remdesivir.  Patient completed antiviral course today 12/23/2018, continues on steroids but otherwise remains without hypoxia or symptoms even with exertion. Patient will need very close follow-up in the next 1 to 2 weeks with PCP to ensure resolution of symptoms, patient will need to check his home oxygen levels with pulse ox at home to ensure no further or worsening hypoxia.  Recommend to continue quarantine for additional 7 days per CDC protocol.  She otherwise stable and agreeable for discharge home.  Discharge Diagnoses:  Principal Problem:   Pneumonia due to COVID-19 virus Active Problems:   Cardiomyopathy, nonischemic (HCC)   Essential hypertension   GERD (gastroesophageal reflux disease)   Mixed hyperlipidemia   OSA on CPAP   Ventricular premature depolarization   Acute respiratory failure with hypoxia (HCC)   Hyponatremia  Discharge Instructions  Discharge Instructions    Call MD for:  difficulty breathing, headache or visual disturbances   Complete by: As directed    Call MD for:  extreme fatigue   Complete by: As directed    Call MD for:  hives   Complete by: As directed    Call MD for:  persistant dizziness or light-headedness   Complete by: As directed    Call MD for:  persistant nausea and vomiting   Complete by: As directed    Call MD for:  temperature >100.4   Complete by: As directed    Diet - low sodium heart healthy   Complete by: As directed    Increase activity slowly   Complete by: As directed      Allergies  as of 12/23/2018   No Known Allergies     Medication List    TAKE these medications   aspirin EC 81 MG tablet Take 81 mg by mouth daily.   fluticasone 50 MCG/ACT nasal spray Commonly known as: FLONASE Place 2 sprays into both nostrils daily.   hydrochlorothiazide 25 MG tablet Commonly known as: HYDRODIURIL Take 25 mg by mouth daily.   losartan 100 MG tablet Commonly known as: COZAAR Take 150 mg by  mouth daily.   metoprolol succinate 50 MG 24 hr tablet Commonly known as: TOPROL-XL Take 50 mg by mouth daily.   omeprazole 20 MG capsule Commonly known as: PRILOSEC Take 20 mg by mouth 2 (two) times daily before a meal.   predniSONE 10 MG tablet Commonly known as: DELTASONE Take 4 tablets (40 mg total) by mouth daily for 3 days, THEN 3 tablets (30 mg total) daily for 3 days, THEN 2 tablets (20 mg total) daily for 3 days, THEN 1 tablet (10 mg total) daily for 3 days. Start taking on: December 23, 2018       No Known Allergies  Procedures/Studies: Dg Chest Port 1 View  Result Date: 12/17/2018 CLINICAL DATA:  Dyspnea, COVID-19. EXAM: PORTABLE CHEST 1 VIEW COMPARISON:  None. FINDINGS: Borderline cardiomegaly. Streaky bilateral perihilar and bibasilar opacities. No pleural effusion or pneumothorax seen. Osseous structures about the chest are unremarkable. IMPRESSION: 1. Borderline cardiomegaly. 2. Streaky bilateral perihilar and basilar opacities. This could represent pneumonia, atelectasis or asymmetric edema. Electronically Signed   By: Franki Cabot M.D.   On: 12/17/2018 16:01    Subjective: No acute issues or events overnight, denies chest pain, shortness of breath, nausea, vomiting, diarrhea, constipation, headache, fevers, chills.   Discharge Exam: Vitals:   12/23/18 0900 12/23/18 0911  BP:  98/72  Pulse:  70  Resp:    Temp:    SpO2: 91%    Vitals:   12/23/18 0432 12/23/18 0759 12/23/18 0900 12/23/18 0911  BP:  128/75  98/72  Pulse:  (!) 57  70  Resp:  19    Temp: 98.2 F (36.8 C) 97.6 F (36.4 C)    TempSrc: Oral Oral    SpO2:  90% 91%   Weight:      Height:        General:  Pleasantly resting in bed, No acute distress. HEENT:  Normocephalic atraumatic.  Sclerae nonicteric, noninjected.  Extraocular movements intact bilaterally. Neck:  Without mass or deformity.  Trachea is midline. Lungs:  Clear to auscultate bilaterally without rhonchi, wheeze, or  rales. Heart:  Regular rate and rhythm.  Without murmurs, rubs, or gallops. Abdomen:  Soft, nontender, nondistended.  Without guarding or rebound. Extremities: Without cyanosis, clubbing, edema, or obvious deformity. Vascular:  Dorsalis pedis and posterior tibial pulses palpable bilaterally. Skin:  Warm and dry, no erythema, no ulcerations.   The results of significant diagnostics from this hospitalization (including imaging, microbiology, ancillary and laboratory) are listed below for reference.    Labs: BNP (last 3 results) Recent Labs    12/17/18 1610  BNP [id number]   Basic Metabolic Panel: Recent Labs  Lab 12/19/18 0320 12/20/18 0430 12/21/18 0340 12/22/18 0415 12/23/18 0509  NA 133* 136 135 137 135  K 3.9 3.9 4.1 4.6 4.4  CL 94* 97* 97* 99 99  CO2 29 29 27 29 24   GLUCOSE 141* 105* 107* 107* 102*  BUN 27* 29* 22 20 24*  CREATININE 0.95 0.91 0.81 0.86 0.89  CALCIUM 9.2 9.2  9.1 9.6 9.4  MG 2.7* 2.6*  --   --   --    Liver Function Tests: Recent Labs  Lab 12/19/18 0320 12/20/18 0430 12/21/18 0340 12/22/18 0415 12/23/18 0509  AST 51* 37 38 32 30  ALT 49* 47* 51* 50* 47*  ALKPHOS 36* 41 43 43 45  BILITOT 0.9 0.8 1.0 1.0 1.1  PROT 6.7 6.6 6.2* 6.5 6.4*  ALBUMIN 3.1* 3.1* 3.1* 3.3* 3.3*   No results for input(s): LIPASE, AMYLASE in the last 168 hours. No results for input(s): AMMONIA in the last 168 hours. CBC: Recent Labs  Lab 12/19/18 0320 12/20/18 0430 12/21/18 0340 12/22/18 0415 12/23/18 0509  WBC 13.0* 11.4* 10.1 11.7* 12.6*  NEUTROABS 11.3* 9.6* 7.9* 8.2* 8.7*  HGB 13.6 13.9 14.3 15.3 15.0  HCT 39.4 41.4 40.9 44.8 44.5  MCV 93.4 94.1 93.2 93.7 93.9  PLT 233 281 277 327 362   D-Dimer Recent Labs    12/21/18 0340 12/23/18 0509  DDIMER 2.30* 2.44*   Urinalysis    Component Value Date/Time   COLORURINE AMBER BIOCHEMICALS MAY BE AFFECTED BY COLOR (A) 10/26/2006 0420   APPEARANCEUR CLOUDY (A) 10/26/2006 0420   LABSPEC 1.028 10/26/2006 0420    PHURINE 5.0 10/26/2006 0420   GLUCOSEU NEGATIVE 10/26/2006 0420   HGBUR MODERATE (A) 10/26/2006 0420   BILIRUBINUR SMALL (A) 10/26/2006 0420   KETONESUR 15 (A) 10/26/2006 0420   PROTEINUR NEGATIVE 10/26/2006 0420   UROBILINOGEN 0.2 10/26/2006 0420   NITRITE NEGATIVE 10/26/2006 0420   LEUKOCYTESUR NEGATIVE 10/26/2006 0420    Time coordinating discharge: Over 30 minutes  SIGNED:   [place] Ishikawa, DO [facility] 12/23/2018, 2:55 PM Pager   If 7PM-7AM, please contact night-coverage [email or web address] Password [email or web address]

## 2018-12-23 NOTE — Progress Notes (Signed)
Walk test performed with pt. Pt up in chair on RA, O2 sats at 92% on RA. During ambulation in hall pt O2 sats maintained in the low 90's and went as low as 88% for a very shift period of time. Pt was able to recover quickly and went back up to 92%. No O2 required, pt did not feel SOB. Pt did 2 laps around nurses station. Once returned to room and pt situated in chair, O2 sats up to 93% on RA. Will continue to monitor

## 2019-01-01 ENCOUNTER — Ambulatory Visit
Admission: RE | Admit: 2019-01-01 | Discharge: 2019-01-01 | Disposition: A | Discharge status: Home / Self Care | Payer: Medicare Other | Source: Ambulatory Visit | Attending: Physician Assistant | Admitting: Physician Assistant

## 2019-01-01 ENCOUNTER — Inpatient Hospital Stay
Admission: EM | Admit: 2019-01-01 | Discharge: 2019-01-05 | DRG: 175 | Disposition: A | Discharge status: Home / Self Care | Payer: Medicare Other | Attending: Internal Medicine | Admitting: Internal Medicine

## 2019-01-01 ENCOUNTER — Other Ambulatory Visit: Payer: Self-pay

## 2019-01-01 ENCOUNTER — Encounter: Payer: Self-pay | Admitting: *Deleted

## 2019-01-01 ENCOUNTER — Other Ambulatory Visit: Payer: Self-pay | Admitting: Physician Assistant

## 2019-01-01 ENCOUNTER — Other Ambulatory Visit (HOSPITAL_COMMUNITY): Payer: Self-pay | Admitting: Physician Assistant

## 2019-01-01 ENCOUNTER — Observation Stay
Admission: EM | Admit: 2019-01-01 | Payer: Medicare Other | Source: Other Acute Inpatient Hospital | Admitting: Family Medicine

## 2019-01-01 DIAGNOSIS — U071 COVID-19: Secondary | ICD-10-CM | POA: Diagnosis present

## 2019-01-01 DIAGNOSIS — T380X5A Adverse effect of glucocorticoids and synthetic analogues, initial encounter: Secondary | ICD-10-CM | POA: Diagnosis present

## 2019-01-01 DIAGNOSIS — Z7951 Long term (current) use of inhaled steroids: Secondary | ICD-10-CM

## 2019-01-01 DIAGNOSIS — Z8619 Personal history of other infectious and parasitic diseases: Secondary | ICD-10-CM | POA: Insufficient documentation

## 2019-01-01 DIAGNOSIS — Z79899 Other long term (current) drug therapy: Secondary | ICD-10-CM

## 2019-01-01 DIAGNOSIS — Z8616 Personal history of COVID-19: Secondary | ICD-10-CM

## 2019-01-01 DIAGNOSIS — E222 Syndrome of inappropriate secretion of antidiuretic hormone: Secondary | ICD-10-CM | POA: Diagnosis present

## 2019-01-01 DIAGNOSIS — I10 Essential (primary) hypertension: Secondary | ICD-10-CM | POA: Diagnosis present

## 2019-01-01 DIAGNOSIS — R0789 Other chest pain: Secondary | ICD-10-CM

## 2019-01-01 DIAGNOSIS — D72828 Other elevated white blood cell count: Secondary | ICD-10-CM | POA: Diagnosis present

## 2019-01-01 DIAGNOSIS — K219 Gastro-esophageal reflux disease without esophagitis: Secondary | ICD-10-CM | POA: Diagnosis present

## 2019-01-01 DIAGNOSIS — R071 Chest pain on breathing: Secondary | ICD-10-CM

## 2019-01-01 DIAGNOSIS — I2699 Other pulmonary embolism without acute cor pulmonale: Secondary | ICD-10-CM | POA: Diagnosis present

## 2019-01-01 DIAGNOSIS — G4733 Obstructive sleep apnea (adult) (pediatric): Secondary | ICD-10-CM | POA: Diagnosis present

## 2019-01-01 DIAGNOSIS — J9601 Acute respiratory failure with hypoxia: Secondary | ICD-10-CM | POA: Diagnosis present

## 2019-01-01 DIAGNOSIS — E875 Hyperkalemia: Secondary | ICD-10-CM | POA: Diagnosis present

## 2019-01-01 DIAGNOSIS — Z7982 Long term (current) use of aspirin: Secondary | ICD-10-CM

## 2019-01-01 DIAGNOSIS — Z87891 Personal history of nicotine dependence: Secondary | ICD-10-CM

## 2019-01-01 DIAGNOSIS — I429 Cardiomyopathy, unspecified: Secondary | ICD-10-CM | POA: Diagnosis present

## 2019-01-01 HISTORY — DX: COVID-19: U07.1

## 2019-01-01 LAB — CBC
HCT: 46 % (ref 39.0–52.0)
Hemoglobin: 15.7 g/dL (ref 13.0–17.0)
MCH: 32.6 pg (ref 26.0–34.0)
MCHC: 34.1 g/dL (ref 30.0–36.0)
MCV: 95.4 fL (ref 80.0–100.0)
Platelets: 219 10*3/uL (ref 150–400)
RBC: 4.82 MIL/uL (ref 4.22–5.81)
RDW: 14.9 % (ref 11.5–15.5)
WBC: 13.6 10*3/uL — ABNORMAL HIGH (ref 4.0–10.5)
nRBC: 0 % (ref 0.0–0.2)

## 2019-01-01 LAB — BRAIN NATRIURETIC PEPTIDE: B Natriuretic Peptide: 51 pg/mL (ref 0.0–100.0)

## 2019-01-01 LAB — BASIC METABOLIC PANEL
Anion gap: 9 (ref 5–15)
BUN: 15 mg/dL (ref 8–23)
CO2: 23 mmol/L (ref 22–32)
Calcium: 9.2 mg/dL (ref 8.9–10.3)
Chloride: 101 mmol/L (ref 98–111)
Creatinine, Ser: 0.84 mg/dL (ref 0.61–1.24)
GFR calc Af Amer: >60 mL/min (ref >60)
GFR calc non Af Amer: >60 mL/min (ref >60)
Glucose, Bld: 94 mg/dL (ref 70–99)
Potassium: 4.1 mmol/L (ref 3.5–5.1)
Sodium: 133 mmol/L — ABNORMAL LOW (ref 135–145)

## 2019-01-01 LAB — TROPONIN I (HIGH SENSITIVITY): Troponin I (High Sensitivity): 11 ng/L (ref <18)

## 2019-01-01 LAB — SARS CORONAVIRUS 2 BY RT PCR (HOSPITAL ORDER, PERFORMED IN ~~LOC~~ HOSPITAL LAB): SARS Coronavirus 2: POSITIVE — AB

## 2019-01-01 MED ORDER — FENTANYL CITRATE (PF) 100 MCG/2ML IJ SOLN
INTRAMUSCULAR | Status: AC
  Filled 2019-01-01: qty 2

## 2019-01-01 MED ORDER — FENTANYL CITRATE (PF) 100 MCG/2ML IJ SOLN
50.0000 ug | Freq: Once | INTRAMUSCULAR | Status: AC
  Administered 2019-01-01: 50 ug via INTRAVENOUS

## 2019-01-01 MED ORDER — SODIUM CHLORIDE 0.9% FLUSH: 3.0000 mL | Freq: Once | INTRAVENOUS | Status: DC

## 2019-01-01 MED ORDER — IOHEXOL 350 MG/ML SOLN
75.0000 mL | Freq: Once | INTRAVENOUS | Status: AC | PRN
  Administered 2019-01-01: 75 mL via INTRAVENOUS

## 2019-01-01 MED ORDER — HEPARIN (PORCINE) 25000 UT/250ML-% IV SOLN
1600.0000 [IU]/h | INTRAVENOUS | Status: DC
  Administered 2019-01-01: 1600 [IU]/h via INTRAVENOUS
  Filled 2019-01-01: qty 250

## 2019-01-01 MED ORDER — HEPARIN BOLUS VIA INFUSION
5000.0000 [IU] | Freq: Once | INTRAVENOUS | Status: AC
  Administered 2019-01-01: 5000 [IU] via INTRAVENOUS
  Filled 2019-01-01: qty 5000

## 2019-01-01 MED ORDER — SODIUM CHLORIDE 0.9 % IV SOLN
Freq: Once | INTRAVENOUS | Status: AC
  Administered 2019-01-01: 19:00:00 via INTRAVENOUS

## 2019-01-01 NOTE — ED Notes (Signed)
Called lab to confirm they have a blue top and are running it. Lab confirms.

## 2019-01-01 NOTE — ED Notes (Signed)
Pt plugged back into monitoring system.

## 2019-01-01 NOTE — ED Triage Notes (Signed)
Pt to ED after a CT angio found a PE and pulmonary infarct. Pt reports right sided chest pain but denies SOB or increased WOB when ambulating. Pt recently treated for COVID in September but reports he was feeling back to baseline before pain started in his chest.

## 2019-01-01 NOTE — Consult Note (Signed)
ANTICOAGULATION CONSULT NOTE - Initial Consult  Pharmacy Consult for Heparin Indication: chest pain/ACS and pulmonary embolus  No Known Allergies  Patient Measurements: Weight: 233 lb 4 oz (105.8 kg) Heparin Dosing Weight: 105 kg  Vital Signs: Temp: 97.7 F (36.5 C) (10/05 1644) Temp Source: Oral (10/05 1644) BP: 126/80 (10/05 1644) Pulse Rate: 78 (10/05 1644)  Labs: Recent Labs    01/01/19 1653  HGB 15.7  HCT 46.0  PLT 219    Estimated Creatinine Clearance: 97.2 mL/min (by C-G formula based on SCr of 0.89 mg/dL).   Medical History: Past Medical History:  Diagnosis Date  . COVID-19   . GERD (gastroesophageal reflux disease)   . Hypertension   . Idiopathic cardiomyopathy ([place])   . Sleep apnea     Medications:  (Not in a hospital admission)  Scheduled:  Infusions:  PRN:   Assessment: Pharmacy consulted for ACS. Trop unimpressive. CT chest: Findings compatible with acute pulmonary embolus predominately involving the right lower lobe segmental and subsegmental pulmonary arteries. No DOAC PTA.   Goal of Therapy:  Heparin level 0.3-0.7 units/ml Monitor platelets by anticoagulation protocol: Yes   Plan:  Give 5000 units bolus x 1 Start heparin infusion at 1600 units/hr Check anti-Xa level in 8 hours and daily while on heparin Continue to monitor H&H and platelets  Oswald Hillock 01/01/2019,6:53 PM

## 2019-01-01 NOTE — ED Notes (Signed)
EKG completed

## 2019-01-01 NOTE — ED Notes (Signed)
Pt's wife called asking if she could drop off pt's P[employer] consultant. Told this is okay.

## 2019-01-01 NOTE — ED Notes (Signed)
EDP Ricky Petersen verbal not to draw 2nd troponin at 20:43. States not needed.

## 2019-01-01 NOTE — ED Provider Notes (Signed)
[facility] Emergency Department Provider Note    First MD Initiated Contact with Patient 01/01/19 1833     (approximate)  I have reviewed the triage vital signs and the nursing notes.   HISTORY  Chief Complaint Chest Pain    HPI Ricky Petersen is a 73 y.o. male below listed past medical history recent admission to [facility] for COVID-19 associated illness presents the ER for evaluation of pulmonary embolism and pulmonary infarct that was diagnosed as an outpatient.  Is currently pain-free and states that he feels very well.  Denies any shortness of breath.    Past Medical History:  Diagnosis Date  . COVID-19   . GERD (gastroesophageal reflux disease)   . Hypertension   . Idiopathic cardiomyopathy ([place])   . Sleep apnea    History reviewed. No pertinent family history. Past Surgical History:  Procedure Laterality Date  . CARDIAC CATHETERIZATION    . COLONOSCOPY WITH PROPOFOL N/A 08/16/2016   Procedure: COLONOSCOPY WITH PROPOFOL;  Surgeon: Manya Silvas, MD;  Location: [facility] ENDOSCOPY;  Service: Endoscopy;  Laterality: N/A;   Patient Active Problem List   Diagnosis Date Noted  . Hyponatremia 12/18/2018  . Pneumonia due to COVID-19 virus 12/17/2018  . Acute respiratory failure with hypoxia ([place]) 12/17/2018  . Essential tremor 05/23/2017  . Systolic congestive heart failure ([place]) 04/28/2016  . Ventricular premature depolarization 04/28/2016  . Cardiomyopathy, nonischemic ([place]) 04/24/2015  . Mixed hyperlipidemia 02/14/2014  . Essential hypertension 08/03/2013  . GERD (gastroesophageal reflux disease) 08/03/2013  . OSA on CPAP 08/03/2013      Prior to Admission medications   Medication Sig Start Date End Date Taking? Authorizing Provider  aspirin EC 81 MG tablet Take 81 mg by mouth daily.    [provider]  fluticasone (FLONASE) 50 MCG/ACT nasal spray Place 2 sprays into both nostrils daily.    [provider]   hydrochlorothiazide (HYDRODIURIL) 25 MG tablet Take 25 mg by mouth daily. 10/12/18   [provider]  losartan (COZAAR) 100 MG tablet Take 150 mg by mouth daily.     [provider]  metoprolol succinate (TOPROL-XL) 50 MG 24 hr tablet Take 50 mg by mouth daily. 10/25/18   [provider]  omeprazole (PRILOSEC) 20 MG capsule Take 20 mg by mouth 2 (two) times daily before a meal.    [provider]  predniSONE (DELTASONE) 10 MG tablet Take 4 tablets (40 mg total) by mouth daily for 3 days, THEN 3 tablets (30 mg total) daily for 3 days, THEN 2 tablets (20 mg total) daily for 3 days, THEN 1 tablet (10 mg total) daily for 3 days. 12/23/18 01/04/19  Little Ishikawa, MD    Allergies Patient has no known allergies.    Social History Social History   Tobacco Use  . Smoking status: Former Research scientist (life sciences)  . Smokeless tobacco: Never Used  Substance Use Topics  . Alcohol use: Yes  . Drug use: No    Review of Systems Patient denies headaches, rhinorrhea, blurry vision, numbness, shortness of breath, chest pain, edema, cough, abdominal pain, nausea, vomiting, diarrhea, dysuria, fevers, rashes or hallucinations unless otherwise stated above in HPI. ____________________________________________   PHYSICAL EXAM:  VITAL SIGNS: Vitals:   01/01/19 1929 01/01/19 1930  BP:  126/78  Pulse:  (!) 49  Resp:  (!) 22  Temp:    SpO2: 96%     Constitutional: Alert and oriented.  Eyes: Conjunctivae are normal.  Head: Atraumatic.  Nose: No congestion/rhinnorhea. Mouth/Throat: Mucous membranes are moist.   Neck: No stridor. Painless ROM.  Cardiovascular: Normal rate, regular rhythm. Grossly normal heart sounds.  Good peripheral circulation. Respiratory: Normal respiratory effort.  No retractions. Lungs CTAB. Gastrointestinal: Soft and nontender. No distention. No abdominal bruits. No CVA tenderness. Genitourinary:  Musculoskeletal: No lower extremity tenderness nor edema.   No joint effusions. Neurologic:  Normal speech and language. No gross focal neurologic deficits are appreciated. No facial droop Skin:  Skin is warm, dry and intact. No rash noted. Psychiatric: Mood and affect are normal. Speech and behavior are normal.  ____________________________________________   LABS (all labs ordered are listed, but only abnormal results are displayed)  Results for orders placed or performed during the hospital encounter of 01/01/19 (from the past 24 hour(s))  CBC     Status: Abnormal   Collection Time: 01/01/19  4:53 PM  Result Value Ref Range   WBC 13.6 (H) 4.0 - 10.5 K/uL   RBC 4.82 4.22 - 5.81 MIL/uL   Hemoglobin 15.7 13.0 - 17.0 g/dL   HCT 46.0 39.0 - 52.0 %   MCV 95.4 80.0 - 100.0 fL   MCH 32.6 26.0 - 34.0 pg   MCHC 34.1 30.0 - 36.0 g/dL   RDW 14.9 11.5 - 15.5 %   Platelets 219 150 - 400 K/uL   nRBC 0.0 0.0 - 0.2 %  Basic metabolic panel     Status: Abnormal   Collection Time: 01/01/19  6:43 PM  Result Value Ref Range   Sodium 133 (L) 135 - 145 mmol/L   Potassium 4.1 3.5 - 5.1 mmol/L   Chloride 101 98 - 111 mmol/L   CO2 23 22 - 32 mmol/L   Glucose, Bld 94 70 - 99 mg/dL   BUN 15 8 - 23 mg/dL   Creatinine, Ser 0.84 0.61 - 1.24 mg/dL   Calcium 9.2 8.9 - 10.3 mg/dL   GFR calc non Af Amer >60 >60 mL/min   GFR calc Af Amer >60 >60 mL/min   Anion gap 9 5 - 15  Troponin I (High Sensitivity)     Status: None   Collection Time: 01/01/19  6:43 PM  Result Value Ref Range   Troponin I (High Sensitivity) 11 <18 ng/L  SARS Coronavirus 2 [facility] order, Performed in [place] hospital lab) Nasopharyngeal Nasopharyngeal Swab     Status: Abnormal   Collection Time: 01/01/19  6:57 PM   Specimen: Nasopharyngeal Swab  Result Value Ref Range   SARS Coronavirus 2 POSITIVE (A) NEGATIVE   ____________________________________________  EKG My review and personal interpretation at Time: 16:36   Indication: chest pain  Rate: 70  Rhythm: sinus with occasional  pvc Axis: normal Other: normal intervals, no stemi ____________________________________________  RADIOLOGY  I personally reviewed all radiographic images ordered to evaluate for the above acute complaints and reviewed radiology reports and findings.  These findings were personally discussed with the patient.  Please see medical record for radiology report.  ____________________________________________   PROCEDURES  Procedure(s) performed:  .Critical Care Performed by: Merlyn Lot, MD Authorized by: Merlyn Lot, MD   Critical care provider statement:    Critical care time (minutes):  30   Critical care time was exclusive of:  Separately billable procedures and treating other patients   Critical care was necessary to treat or prevent imminent or life-threatening deterioration of the following conditions:  Respiratory failure   Critical care was time spent personally by me on the following activities:  Development  of treatment plan with patient or surrogate, discussions with consultants, evaluation of patient's response to treatment, examination of patient, obtaining history from patient or surrogate, ordering and performing treatments and interventions, ordering and review of laboratory studies, ordering and review of radiographic studies, pulse oximetry, re-evaluation of patient's condition and review of old charts      Critical Care performed: yes ____________________________________________   INITIAL IMPRESSION / ASSESSMENT AND PLAN / ED COURSE  Pertinent labs & imaging results that were available during my care of the patient were reviewed by me and considered in my medical decision making (see chart for details).   DDX: Asthma, copd, CHF, pna, ptx, malignancy, Pe, anemia   Ricky Petersen is a 73 y.o. who presents to the ED with recent diagnosis of COVID-19 presents the ER with right-sided chest pain in outpatient diagnosis PE with pulmonary infarct.  Discussed  case with pulmonology and given his age risk factors and recent COVID-19 will initiate heparin.  He is otherwise hemodynamically stable.  Will discuss with hospitalist for admission for further medical management.     The patient was evaluated in Emergency Department today for the symptoms described in the history of present illness. He/she was evaluated in the context of the global COVID-19 pandemic, which necessitated consideration that the patient might be at risk for infection with the SARS-CoV-2 virus that causes COVID-19. Institutional protocols and algorithms that pertain to the evaluation of patients at risk for COVID-19 are in a state of rapid change based on information released by regulatory bodies including the CDC and federal and state organizations. These policies and algorithms were followed during the patient's care in the ED.  As part of my medical decision making, I reviewed the following data within the [facility] notes reviewed and incorporated, Labs reviewed, notes from prior ED visits and [place] Controlled Substance Database   ____________________________________________   FINAL CLINICAL IMPRESSION(S) / ED DIAGNOSES  Final diagnoses:  Other acute pulmonary embolism, unspecified whether acute cor pulmonale present ([place])  Pulmonary infarct ([place])      NEW MEDICATIONS STARTED DURING THIS VISIT:  New Prescriptions   No medications on file     Note:  This document was prepared using Dragon voice recognition software and may include unintentional dictation errors.    Merlyn Lot, MD 01/01/19 2029

## 2019-01-01 NOTE — ED Notes (Signed)
Staff from lobby of ED dropped off pt's phone charger. Given to pt.

## 2019-01-01 NOTE — ED Notes (Signed)
Sent redraw of green tube to lab

## 2019-01-01 NOTE — ED Notes (Signed)
Lab called to remind to add-on BNP.

## 2019-01-01 NOTE — ED Notes (Signed)
EKG to [place]. Pt up to bedside toilet.

## 2019-01-01 NOTE — ED Notes (Signed)
Pt yelling stating that he is in worse pain to rt side/flank. MD made aware, awaiting orders.

## 2019-01-02 ENCOUNTER — Inpatient Hospital Stay (HOSPITAL_COMMUNITY)
Admit: 2019-01-02 | Discharge: 2019-01-02 | Disposition: A | Discharge status: Home / Self Care | Payer: Medicare Other | Attending: Internal Medicine | Admitting: Internal Medicine

## 2019-01-02 DIAGNOSIS — T380X5A Adverse effect of glucocorticoids and synthetic analogues, initial encounter: Secondary | ICD-10-CM | POA: Diagnosis present

## 2019-01-02 DIAGNOSIS — I2699 Other pulmonary embolism without acute cor pulmonale: Secondary | ICD-10-CM | POA: Diagnosis present

## 2019-01-02 DIAGNOSIS — K219 Gastro-esophageal reflux disease without esophagitis: Secondary | ICD-10-CM | POA: Diagnosis present

## 2019-01-02 DIAGNOSIS — E875 Hyperkalemia: Secondary | ICD-10-CM | POA: Diagnosis present

## 2019-01-02 DIAGNOSIS — I429 Cardiomyopathy, unspecified: Secondary | ICD-10-CM | POA: Diagnosis present

## 2019-01-02 DIAGNOSIS — I351 Nonrheumatic aortic (valve) insufficiency: Secondary | ICD-10-CM | POA: Diagnosis not present

## 2019-01-02 DIAGNOSIS — U071 COVID-19: Secondary | ICD-10-CM | POA: Diagnosis present

## 2019-01-02 DIAGNOSIS — E222 Syndrome of inappropriate secretion of antidiuretic hormone: Secondary | ICD-10-CM | POA: Diagnosis present

## 2019-01-02 DIAGNOSIS — Z87891 Personal history of nicotine dependence: Secondary | ICD-10-CM | POA: Diagnosis not present

## 2019-01-02 DIAGNOSIS — Z79899 Other long term (current) drug therapy: Secondary | ICD-10-CM | POA: Diagnosis not present

## 2019-01-02 DIAGNOSIS — Z7951 Long term (current) use of inhaled steroids: Secondary | ICD-10-CM | POA: Diagnosis not present

## 2019-01-02 DIAGNOSIS — J9601 Acute respiratory failure with hypoxia: Secondary | ICD-10-CM | POA: Diagnosis present

## 2019-01-02 DIAGNOSIS — G4733 Obstructive sleep apnea (adult) (pediatric): Secondary | ICD-10-CM | POA: Diagnosis present

## 2019-01-02 DIAGNOSIS — Z7982 Long term (current) use of aspirin: Secondary | ICD-10-CM | POA: Diagnosis not present

## 2019-01-02 DIAGNOSIS — D72828 Other elevated white blood cell count: Secondary | ICD-10-CM | POA: Diagnosis present

## 2019-01-02 DIAGNOSIS — I10 Essential (primary) hypertension: Secondary | ICD-10-CM | POA: Diagnosis present

## 2019-01-02 LAB — PROTIME-INR
INR: 1.1 (ref 0.8–1.2)
Prothrombin Time: 13.8 seconds (ref 11.4–15.2)

## 2019-01-02 LAB — CBC
HCT: 49.1 % (ref 39.0–52.0)
Hemoglobin: 16.6 g/dL (ref 13.0–17.0)
MCH: 32.2 pg (ref 26.0–34.0)
MCHC: 33.8 g/dL (ref 30.0–36.0)
MCV: 95.3 fL (ref 80.0–100.0)
Platelets: 228 10*3/uL (ref 150–400)
RBC: 5.15 MIL/uL (ref 4.22–5.81)
RDW: 15 % (ref 11.5–15.5)
WBC: 22.9 10*3/uL — ABNORMAL HIGH (ref 4.0–10.5)
nRBC: 0 % (ref 0.0–0.2)

## 2019-01-02 LAB — HEPARIN LEVEL (UNFRACTIONATED)
Heparin Unfractionated: 1.22 IU/mL — ABNORMAL HIGH (ref 0.30–0.70)
Heparin Unfractionated: 1.72 IU/mL — ABNORMAL HIGH (ref 0.30–0.70)

## 2019-01-02 LAB — APTT
aPTT: 117 seconds — ABNORMAL HIGH (ref 24–36)
aPTT: 89 seconds — ABNORMAL HIGH (ref 24–36)
aPTT: 89 seconds — ABNORMAL HIGH (ref 24–36)

## 2019-01-02 LAB — TROPONIN I (HIGH SENSITIVITY): Troponin I (High Sensitivity): 10 ng/L (ref <18)

## 2019-01-02 MED ORDER — HYDROMORPHONE HCL 1 MG/ML IJ SOLN
0.5000 mg | Freq: Once | INTRAMUSCULAR | Status: AC
  Administered 2019-01-02: 0.5 mg via INTRAVENOUS
  Filled 2019-01-02: qty 1

## 2019-01-02 MED ORDER — FENTANYL CITRATE (PF) 100 MCG/2ML IJ SOLN
INTRAMUSCULAR | Status: AC
  Administered 2019-01-02: 50 ug via INTRAVENOUS
  Filled 2019-01-02: qty 2

## 2019-01-02 MED ORDER — SODIUM CHLORIDE 0.9 % IV BOLUS
500.0000 mL | Freq: Once | INTRAVENOUS | Status: AC
  Administered 2019-01-02: 500 mL via INTRAVENOUS

## 2019-01-02 MED ORDER — GUAIFENESIN 100 MG/5ML PO SOLN: 5.0000 mL | ORAL | Status: DC | PRN

## 2019-01-02 MED ORDER — ACETAMINOPHEN 325 MG PO TABS: 650.0000 mg | ORAL_TABLET | Freq: Four times a day (QID) | ORAL | Status: DC | PRN

## 2019-01-02 MED ORDER — ONDANSETRON HCL 4 MG PO TABS
4.0000 mg | ORAL_TABLET | Freq: Four times a day (QID) | ORAL | Status: DC | PRN
  Administered 2019-01-02: 14:00:00 4 mg via ORAL
  Filled 2019-01-02: qty 1

## 2019-01-02 MED ORDER — SENNOSIDES-DOCUSATE SODIUM 8.6-50 MG PO TABS: 1.0000 | ORAL_TABLET | Freq: Every evening | ORAL | Status: DC | PRN

## 2019-01-02 MED ORDER — ASPIRIN EC 81 MG PO TBEC
81.0000 mg | DELAYED_RELEASE_TABLET | Freq: Every day | ORAL | Status: DC
  Administered 2019-01-03 – 2019-01-05 (×3): 81 mg via ORAL
  Filled 2019-01-02 (×3): qty 1

## 2019-01-02 MED ORDER — FENTANYL CITRATE (PF) 100 MCG/2ML IJ SOLN
50.0000 ug | Freq: Once | INTRAMUSCULAR | Status: AC
  Administered 2019-01-02: 02:00:00 50 ug via INTRAVENOUS

## 2019-01-02 MED ORDER — OXYCODONE HCL 5 MG PO TABS
10.0000 mg | ORAL_TABLET | Freq: Once | ORAL | Status: AC
  Administered 2019-01-02: 10 mg via ORAL
  Filled 2019-01-02: qty 2

## 2019-01-02 MED ORDER — PREDNISONE 10 MG PO TABS
10.0000 mg | ORAL_TABLET | Freq: Every day | ORAL | Status: AC
  Administered 2019-01-02 – 2019-01-04 (×3): 10 mg via ORAL
  Filled 2019-01-02 (×3): qty 1

## 2019-01-02 MED ORDER — ALBUTEROL SULFATE (2.5 MG/3ML) 0.083% IN NEBU: 2.5000 mg | INHALATION_SOLUTION | RESPIRATORY_TRACT | Status: DC | PRN

## 2019-01-02 MED ORDER — HYDROCODONE-ACETAMINOPHEN 5-325 MG PO TABS
1.0000 | ORAL_TABLET | ORAL | Status: DC | PRN
  Administered 2019-01-02 (×3): 1 via ORAL
  Administered 2019-01-03 – 2019-01-05 (×6): 2 via ORAL
  Filled 2019-01-02 (×2): qty 2
  Filled 2019-01-02 (×2): qty 1
  Filled 2019-01-02 (×4): qty 2
  Filled 2019-01-02: qty 1
  Filled 2019-01-02: qty 2

## 2019-01-02 MED ORDER — SODIUM CHLORIDE 0.9% FLUSH
3.0000 mL | Freq: Two times a day (BID) | INTRAVENOUS | Status: DC
  Administered 2019-01-02 – 2019-01-04 (×5): 3 mL via INTRAVENOUS

## 2019-01-02 MED ORDER — HYDROMORPHONE HCL 1 MG/ML IJ SOLN
1.0000 mg | INTRAMUSCULAR | Status: DC | PRN
  Administered 2019-01-02 – 2019-01-03 (×2): 1 mg via INTRAVENOUS
  Filled 2019-01-02 (×2): qty 1

## 2019-01-02 MED ORDER — ACETAMINOPHEN 650 MG RE SUPP: 650.0000 mg | Freq: Four times a day (QID) | RECTAL | Status: DC | PRN

## 2019-01-02 MED ORDER — MORPHINE SULFATE (PF) 4 MG/ML IV SOLN
4.0000 mg | Freq: Once | INTRAVENOUS | Status: AC
  Administered 2019-01-02: 4 mg via INTRAVENOUS
  Filled 2019-01-02: qty 1

## 2019-01-02 MED ORDER — ACETAMINOPHEN 500 MG PO TABS
1000.0000 mg | ORAL_TABLET | Freq: Once | ORAL | Status: AC
  Administered 2019-01-02: 1000 mg via ORAL
  Filled 2019-01-02: qty 2

## 2019-01-02 MED ORDER — KETOROLAC TROMETHAMINE 30 MG/ML IJ SOLN
15.0000 mg | Freq: Four times a day (QID) | INTRAMUSCULAR | Status: DC | PRN
  Administered 2019-01-02 – 2019-01-05 (×7): 15 mg via INTRAVENOUS
  Filled 2019-01-02 (×7): qty 1

## 2019-01-02 MED ORDER — FENTANYL CITRATE (PF) 100 MCG/2ML IJ SOLN
50.0000 ug | Freq: Once | INTRAMUSCULAR | Status: AC
  Administered 2019-01-02: 50 ug via INTRAVENOUS

## 2019-01-02 MED ORDER — SODIUM CHLORIDE 0.9% FLUSH: 3.0000 mL | INTRAVENOUS | Status: DC | PRN

## 2019-01-02 MED ORDER — PANTOPRAZOLE SODIUM 40 MG PO TBEC
40.0000 mg | DELAYED_RELEASE_TABLET | Freq: Every day | ORAL | Status: DC
  Administered 2019-01-03 – 2019-01-05 (×3): 40 mg via ORAL
  Filled 2019-01-02 (×3): qty 1

## 2019-01-02 MED ORDER — HEPARIN (PORCINE) 25000 UT/250ML-% IV SOLN
1400.0000 [IU]/h | INTRAVENOUS | Status: DC
  Administered 2019-01-02 – 2019-01-03 (×2): 1300 [IU]/h via INTRAVENOUS
  Administered 2019-01-03: 1400 [IU]/h via INTRAVENOUS
  Filled 2019-01-02 (×3): qty 250

## 2019-01-02 MED ORDER — MORPHINE SULFATE (PF) 4 MG/ML IV SOLN
4.0000 mg | INTRAVENOUS | Status: DC | PRN
  Administered 2019-01-02: 17:00:00 4 mg via INTRAVENOUS
  Filled 2019-01-02: qty 1

## 2019-01-02 MED ORDER — BISACODYL 5 MG PO TBEC
5.0000 mg | DELAYED_RELEASE_TABLET | Freq: Every day | ORAL | Status: DC | PRN
  Administered 2019-01-04: 10:00:00 5 mg via ORAL
  Filled 2019-01-02 (×2): qty 1

## 2019-01-02 MED ORDER — FLUTICASONE PROPIONATE 50 MCG/ACT NA SUSP
2.0000 | Freq: Every day | NASAL | Status: DC
  Administered 2019-01-03 – 2019-01-05 (×3): 2 via NASAL
  Filled 2019-01-02: qty 16

## 2019-01-02 MED ORDER — SODIUM CHLORIDE 0.9 % IV SOLN: 250.0000 mL | INTRAVENOUS | Status: DC | PRN

## 2019-01-02 MED ORDER — ONDANSETRON HCL 4 MG/2ML IJ SOLN: 4.0000 mg | Freq: Four times a day (QID) | INTRAMUSCULAR | Status: DC | PRN

## 2019-01-02 NOTE — ED Notes (Signed)
Assisted patient with ambulation to commode. Pt able to urinate and returned to bed. Exertion aggravated pain. Will continue to monitor.

## 2019-01-02 NOTE — ED Notes (Signed)
This Rn transported pt room 111

## 2019-01-02 NOTE — ED Notes (Signed)
Pt provided with apple juice and a sandwich tray per pt's request.

## 2019-01-02 NOTE — ED Notes (Signed)
Received call from Shanon Brow in the pharmacy with verbal orders to stop heparin drip until 0730.

## 2019-01-02 NOTE — ED Notes (Signed)
Rn Margreta Journey attempted to give report to room 233 w/o success. Charge RN, Colletta Maryland made aware.

## 2019-01-02 NOTE — ED Notes (Signed)
Responded to call bell. Pt reports return of lower right side rib pain. Dr Alfred Levins notified and received verbal for 71mcg fentanyl.

## 2019-01-02 NOTE — Progress Notes (Signed)
Spoke with Dr. Delaine Lame, she agrees to follow algorithm for discontinuation of isolation. Since patient's first test was over 21 days ago, symptoms improved, and he is not readmitted for worse Covid-related respiratory symptoms, discontinuation of isolation is appropriate.

## 2019-01-02 NOTE — ED Provider Notes (Signed)
Asked by chief medical officer to admit this patient here.  Spoke with Dr. Estanislado Pandy he will admit   Ricky Drafts, MD 01/02/19 0800

## 2019-01-02 NOTE — ED Notes (Signed)
Pt sitting on side of bed eating apple sauce and meal tray with NAD noted.

## 2019-01-02 NOTE — ED Notes (Signed)
Pt c/o central Cp no radiation. VSS. NAd noted at this time.

## 2019-01-02 NOTE — ED Notes (Signed)
This Rn attempted to provide report, RN Danae Chen, pt will not be not be going to room 239. This RN notified charge RN, Management consultant.

## 2019-01-02 NOTE — Progress Notes (Signed)
ANTICOAGULATION CONSULT NOTE - Initial Consult  Pharmacy Consult for Heparin f Indication: ACS /  PE   No Known Allergies  Patient Measurements: Weight: 233 lb 4 oz (105.8 kg) Heparin Dosing Weight: 105   Vital Signs: Temp: 98.5 F (36.9 C) (10/06 2052) BP: 102/66 (10/06 2052) Pulse Rate: 96 (10/06 2123)  Labs: Recent Labs    01/01/19 1653 01/01/19 1843 01/02/19 0356 01/02/19 0813 01/02/19 1341 01/02/19 1921 01/02/19 2147  HGB 15.7  --   --  16.6  --   --   --   HCT 46.0  --   --  49.1  --   --   --   PLT 219  --   --  228  --   --   --   APTT  --   --  117*  --  89*  --  89*  LABPROT  --   --  13.8  --   --   --   --   INR  --   --  1.1  --   --   --   --   HEPARINUNFRC  --   --  1.22*  --  1.72*  --   --   CREATININE  --  0.84  --   --   --   --   --   TROPONINIHS  --  11  --   --   --  10  --     Estimated Creatinine Clearance: 103 mL/min (by C-G formula based on SCr of 0.84 mg/dL).   Medical History: Past Medical History:  Diagnosis Date  . COVID-19   . GERD (gastroesophageal reflux disease)   . Hypertension   . Idiopathic cardiomyopathy ([place])   . Sleep apnea     Medications:  Medications Prior to Admission  Medication Sig Dispense Refill Last Dose  . aspirin EC 81 MG tablet Take 81 mg by mouth daily.   01/01/2019 at 1200  . fluticasone (FLONASE) 50 MCG/ACT nasal spray Place 2 sprays into both nostrils daily.   01/01/2019 at 1200  . hydrochlorothiazide (HYDRODIURIL) 25 MG tablet Take 25 mg by mouth daily.   01/01/2019 at 1200  . losartan (COZAAR) 100 MG tablet Take 150 mg by mouth daily.    01/01/2019 at 1200  . metoprolol succinate (TOPROL-XL) 50 MG 24 hr tablet Take 50 mg by mouth daily.   01/01/2019 at 1200  . omeprazole (PRILOSEC) 20 MG capsule Take 20 mg by mouth 2 (two) times daily before a meal.   01/01/2019 at 1200  . predniSONE (DELTASONE) 10 MG tablet Take 4 tablets (40 mg total) by mouth daily for 3 days, THEN 3 tablets (30 mg total) daily for 3  days, THEN 2 tablets (20 mg total) daily for 3 days, THEN 1 tablet (10 mg total) daily for 3 days. 30 tablet 0 01/01/2019 at 1200    Assessment: Pharmacy consulted for ACS. Trop unimpressive. CT chest: Findings compatible with acute pulmonary embolus predominately involving the right lower lobe segmental and subsegmental pulmonary arteries. No DOAC PTA.   10/6 0356 APTT 117 and HL 1.22 held for 1 hour and decreased to 1300.  10/6 1341 APTT 89  Goal of Therapy:  Heparin level 0.3-0.7 units/ml Monitor platelets by anticoagulation protocol: Yes   Plan:  APTT is therapeutic. Will continue rate of 1300 units/hr. Will check HL and CBC with AM labs and aPTT at 2200. Pharmacy will continue to monitor.  10/6:  APTT @  2147 = 89 Will continue pt on current rate and recheck HL and aPTT on 10/7 with AM labs.   Esme Freund D 01/02/2019,10:38 PM

## 2019-01-02 NOTE — H&P (Addendum)
[facility] NAME: Deavonte Thor    MR#:  [id number]  DATE OF BIRTH:  Jul 12, 1945  DATE OF ADMISSION:  01/01/2019  PRIMARY CARE PHYSICIAN: Tracie Harrier, MD   REQUESTING/REFERRING PHYSICIAN: Dr. Corky Downs.  CHIEF COMPLAINT:   Chief Complaint  Patient presents with  . Chest Pain   Right-sided chest pain for 6 days. HISTORY OF PRESENT ILLNESS:  Rakim Zhai  is a 73 y.o. male with a known history of recent COVID-19 pneumonia, hypertension, idiopathic cardiomyopathy, sleep apnea and GERD.  The patient presents the ED with above chief complaints.  He has been recently treated with steroid and completed antiviral course for COVID-19 pneumonia in [facility].  He was on oxygen during hospitalization and was discharged without oxygen.  He has had worsening right-sided chest pain for the past 6 days.  In addition, he has shortness of breath and some cough but no hemoptysis.  He got CT angiogram of the chest as outpatient and found PE.  He is sent to ED for further treatment.  Heparin drip is started.  ED physician request admission. PAST MEDICAL HISTORY:   Past Medical History:  Diagnosis Date  . COVID-19   . GERD (gastroesophageal reflux disease)   . Hypertension   . Idiopathic cardiomyopathy ([place])   . Sleep apnea     PAST SURGICAL HISTORY:   Past Surgical History:  Procedure Laterality Date  . CARDIAC CATHETERIZATION    . COLONOSCOPY WITH PROPOFOL N/A 08/16/2016   Procedure: COLONOSCOPY WITH PROPOFOL;  Surgeon: Manya Silvas, MD;  Location: [facility] ENDOSCOPY;  Service: Endoscopy;  Laterality: N/A;    SOCIAL HISTORY:   Social History   Tobacco Use  . Smoking status: Former Research scientist (life sciences)  . Smokeless tobacco: Never Used  Substance Use Topics  . Alcohol use: Yes    FAMILY HISTORY:  History reviewed. No pertinent family history.  DRUG ALLERGIES:  No Known Allergies  REVIEW OF SYSTEMS:   Review of Systems   Constitutional: Negative for chills, fever and malaise/fatigue.  HENT: Negative for sore throat.   Eyes: Negative for blurred vision and double vision.  Respiratory: Positive for shortness of breath. Negative for cough, hemoptysis, sputum production, wheezing and stridor.   Cardiovascular: Positive for chest pain. Negative for palpitations, orthopnea and leg swelling.  Gastrointestinal: Negative for abdominal pain, blood in stool, diarrhea, melena, nausea and vomiting.  Genitourinary: Negative for dysuria, flank pain and hematuria.  Musculoskeletal: Negative for back pain and joint pain.  Skin: Negative for rash.  Neurological: Negative for dizziness, sensory change, focal weakness, seizures, loss of consciousness, weakness and headaches.  Endo/Heme/Allergies: Negative for polydipsia.  Psychiatric/Behavioral: Negative for depression. The patient is not nervous/anxious.     MEDICATIONS AT HOME:   Prior to Admission medications   Medication Sig Start Date End Date Taking? Authorizing Provider  aspirin EC 81 MG tablet Take 81 mg by mouth daily.   Yes [provider]  fluticasone (FLONASE) 50 MCG/ACT nasal spray Place 2 sprays into both nostrils daily.   Yes [provider]  hydrochlorothiazide (HYDRODIURIL) 25 MG tablet Take 25 mg by mouth daily. 10/12/18  Yes [provider]  losartan (COZAAR) 100 MG tablet Take 150 mg by mouth daily.    Yes [provider]  metoprolol succinate (TOPROL-XL) 50 MG 24 hr tablet Take 50 mg by mouth daily. 10/25/18  Yes [provider]  omeprazole (PRILOSEC) 20 MG capsule Take 20 mg by mouth  2 (two) times daily before a meal.   Yes [provider]  predniSONE (DELTASONE) 10 MG tablet Take 4 tablets (40 mg total) by mouth daily for 3 days, THEN 3 tablets (30 mg total) daily for 3 days, THEN 2 tablets (20 mg total) daily for 3 days, THEN 1 tablet (10 mg total) daily for 3 days. 12/23/18 01/04/19 Yes Little Ishikawa, MD      VITAL SIGNS:  Blood pressure 110/75, pulse 77, temperature 97.7 F (36.5 C), temperature source Oral, resp. rate (!) 28, weight 105.8 kg, SpO2 91 %.  PHYSICAL EXAMINATION:  Physical Exam  GENERAL:  73 y.o.-year-old patient lying in the bed with no acute distress.  EYES: Pupils equal, round, reactive to light and accommodation. No scleral icterus. Extraocular muscles intact.  HEENT: Head atraumatic, normocephalic. NECK:  Supple, no jugular venous distention. No thyroid enlargement, no tenderness.  LUNGS: Normal breath sounds bilaterally, no wheezing, rales,rhonchi or crepitation. No use of accessory muscles of respiration.  CARDIOVASCULAR: S1, S2 normal. No murmurs, rubs, or gallops.  ABDOMEN: Soft, nontender, nondistended. Bowel sounds present. No organomegaly or mass.  EXTREMITIES: No pedal edema, cyanosis, or clubbing.  NEUROLOGIC: Cranial nerves II through XII are intact. Muscle strength 5/5 in all extremities. Sensation intact. Gait not checked.  PSYCHIATRIC: The patient is alert and oriented x 3.  SKIN: No obvious rash, lesion, or ulcer.   LABORATORY PANEL:   CBC Recent Labs  Lab 01/02/19 0813  WBC 22.9*  HGB 16.6  HCT 49.1  PLT 228   ------------------------------------------------------------------------------------------------------------------  Chemistries  Recent Labs  Lab 01/01/19 1843  NA 133*  K 4.1  CL 101  CO2 23  GLUCOSE 94  BUN 15  CREATININE 0.84  CALCIUM 9.2   ------------------------------------------------------------------------------------------------------------------  Cardiac Enzymes No results for input(s): TROPONINI in the last 168 hours. ------------------------------------------------------------------------------------------------------------------  RADIOLOGY:  Ct Angio Chest Pe W Or Wo Contrast  Result Date: 01/01/2019 CLINICAL DATA:  Patient with chest pain and soreness. Recently discharged after being  diagnosed with COVID-19. EXAM: CT ANGIOGRAPHY CHEST WITH CONTRAST TECHNIQUE: Multidetector CT imaging of the chest was performed using the standard protocol during bolus administration of intravenous contrast. Multiplanar CT image reconstructions and MIPs were obtained to evaluate the vascular anatomy. CONTRAST:  43mL OMNIPAQUE IOHEXOL 350 MG/ML SOLN COMPARISON:  CT chest 02/25/2014 FINDINGS: Cardiovascular: Heart is mildly enlarged. Trace fluid superior pericardial recess. Thoracic aortic vascular calcifications. Adequate opacification of the pulmonary arterial system. Mild motion artifact limits evaluation. Filling defect within the right lower lobe segmental and subsegmental pulmonary arteries (image 143; series 5). Mediastinum/Nodes: No enlarged axillary, mediastinal or hilar lymphadenopathy. Normal appearance of the esophagus. Small hiatal hernia. Lungs/Pleura: Central airways are patent. Centrilobular and paraseptal emphysematous changes. Fairly extensive bilateral peripheral ground-glass opacities most pronounced within the lower lobes. Within the right lower lobe there is a 2.5 x 2.8 cm subpleural centrally cavitary nodular area of consolidation (image 58; series 4). Upper Abdomen: Similar subcentimeter low-attenuation hepatic lesions. No acute process. Musculoskeletal: Thoracic spine degenerative changes. No aggressive or acute appearing osseous lesions. Review of the MIP images confirms the above findings. IMPRESSION: 1. Findings compatible with acute pulmonary embolus predominately involving the right lower lobe segmental and subsegmental pulmonary arteries. 2. Within the subpleural right lower lobe there is a 2.5 cm central cavitary nodular area of consolidation which may represent an evolving pulmonary infarct. 3. Additional bilateral patchy predominately ground-glass opacities which may represent atypical/viral infectious process. 4. Aortic Atherosclerosis (ICD10-I70.0) and Emphysema (ICD10-J43.9). 5.  Critical Value/emergent results were called by telephone at the time of interpretation on 01/01/2019 at 4:22 pm to [place] , who verbally acknowledged these results. Electronically Signed   By: Lovey Newcomer M.D.   On: 01/01/2019 16:23      IMPRESSION AND PLAN:   Acute respiratory failure with hypoxia due to pulmonary embolism and recent COVID-19 pneumonia. The patient will be admitted to telemetry floor. The patient is a started with heparin drip. Continue oxygen by nasal cannula, albuterol as needed, continue home prednisone.  Leukocytosis, possible due to steroid.  Follow-up CBC.  Hyponatremia.  Hold HCTZ.  Follow-up sodium level.  Hypertension.  Hold hypertension medication due to low side blood pressure.  OSA.  CPAP at night. All the records are reviewed and case discussed with ED provider. Management plans discussed with the patient, family and they are in agreement.  CODE STATUS: Full code.  TOTAL TIME TAKING CARE OF THIS PATIENT: 50 minutes.    Demetrios Loll M.D on 01/02/2019 at 10:30 AM  Between 7am to 6pm - Pager - [phone]  After 6pm go to [email or web address] - Proofreader  [facility]  Office  [phone]  CC: Primary care physician; Tracie Harrier, MD   Note: This dictation was prepared with Dragon dictation along with smaller phrase technology. Any transcriptional errors that result from this process are unin

## 2019-01-02 NOTE — Plan of Care (Signed)
  Problem: Consults Goal: Venous Thromboembolism Patient Education Description: See Patient Education Module for education specifics. Outcome: Progressing   Problem: Phase I Progression Outcomes Goal: Pain controlled with appropriate interventions Outcome: Not Progressing   Problem: Education: Goal: Knowledge of General Education information will improve Description: Including pain rating scale, medication(s)/side effects and non-pharmacologic comfort measures Outcome: Progressing   Problem: Clinical Measurements: Goal: Diagnostic test results will improve Outcome: Progressing Goal: Respiratory complications will improve Outcome: Progressing   Problem: Pain Managment: Goal: General experience of comfort will improve Outcome: Not Progressing

## 2019-01-02 NOTE — Consult Note (Signed)
ANTICOAGULATION CONSULT NOTE - Initial Consult  Pharmacy Consult for Heparin Indication: chest pain/ACS and pulmonary embolus  No Known Allergies  Patient Measurements: Weight: 233 lb 4 oz (105.8 kg) Heparin Dosing Weight: 105 kg  Vital Signs: BP: 116/88 (10/06 1300) Pulse Rate: 74 (10/06 1300)  Labs: Recent Labs    01/01/19 1653 01/01/19 1843 01/02/19 0356 01/02/19 0813 01/02/19 1341  HGB 15.7  --   --  16.6  --   HCT 46.0  --   --  49.1  --   PLT 219  --   --  228  --   APTT  --   --  117*  --  89*  LABPROT  --   --  13.8  --   --   INR  --   --  1.1  --   --   HEPARINUNFRC  --   --  1.22*  --   --   CREATININE  --  0.84  --   --   --   TROPONINIHS  --  11  --   --   --     Estimated Creatinine Clearance: 103 mL/min (by C-G formula based on SCr of 0.84 mg/dL).   Medical History: Past Medical History:  Diagnosis Date  . COVID-19   . GERD (gastroesophageal reflux disease)   . Hypertension   . Idiopathic cardiomyopathy ([place])   . Sleep apnea     Medications:  (Not in a hospital admission)  Scheduled:  Infusions:  PRN:   Assessment: Pharmacy consulted for ACS. Trop unimpressive. CT chest: Findings compatible with acute pulmonary embolus predominately involving the right lower lobe segmental and subsegmental pulmonary arteries. No DOAC PTA.   10/6 0356 APTT 117 and HL 1.22 held for 1 hour and decreased to 1300.  10/6 1341 APTT 89  Goal of Therapy:  Heparin level 0.3-0.7 units/ml Monitor platelets by anticoagulation protocol: Yes   Plan:  APTT is therapeutic. Will continue rate of 1300 units/hr. Will check HL and CBC with AM labs and aPTT at 2200. Pharmacy will continue to monitor.  Eleonore Chiquito, PharmD, BCPS Clinical Pharmacist 01/02/2019,2:21 PM

## 2019-01-02 NOTE — Consult Note (Signed)
ANTICOAGULATION CONSULT NOTE - Initial Consult  Pharmacy Consult for Heparin Indication: chest pain/ACS and pulmonary embolus  No Known Allergies  Patient Measurements: Weight: 233 lb 4 oz (105.8 kg) Heparin Dosing Weight: 105 kg  Vital Signs: BP: 128/94 (10/06 0530) Pulse Rate: 68 (10/06 0530)  Labs: Recent Labs    01/01/19 1653 01/01/19 1843 01/02/19 0356  HGB 15.7  --   --   HCT 46.0  --   --   PLT 219  --   --   APTT  --   --  117*  LABPROT  --   --  13.8  INR  --   --  1.1  HEPARINUNFRC  --   --  1.22*  CREATININE  --  0.84  --   TROPONINIHS  --  11  --     Estimated Creatinine Clearance: 103 mL/min (by C-G formula based on SCr of 0.84 mg/dL).   Medical History: Past Medical History:  Diagnosis Date  . COVID-19   . GERD (gastroesophageal reflux disease)   . Hypertension   . Idiopathic cardiomyopathy ([place])   . Sleep apnea     Medications:  (Not in a hospital admission)  Scheduled:  Infusions:  PRN:   Assessment: Pharmacy consulted for ACS. Trop unimpressive. CT chest: Findings compatible with acute pulmonary embolus predominately involving the right lower lobe segmental and subsegmental pulmonary arteries. No DOAC PTA.   Goal of Therapy:  Heparin level 0.3-0.7 units/ml Monitor platelets by anticoagulation protocol: Yes   Plan:  10/06 @ 0400 HL 1.22, aPTT 114 seconds. Will hold heparin drip for 1 hour and decrease rate to 1300 units/hr and to re-start at 0730. Will check HL at 1530 and aPTT at 1330 for correlation, will continue to monitor.  Tobie Lords, PharmD, BCPS Clinical Pharmacist 01/02/2019,6:33 AM

## 2019-01-02 NOTE — ED Notes (Signed)
Responded to call bell. Pt stated he felt like he was "gasping for air" because his rib pain was unbearable. O2 sats at 88%. Pt placed on 4L O2 via nasal cannula. O2 sats improved to 94%

## 2019-01-02 NOTE — ED Notes (Signed)
This RN spoke with Danae Chen RN to provide report. RN Danae Chen will call this RN .

## 2019-01-02 NOTE — ED Notes (Signed)
This Rn attempted to provide report to assigned RN room 111 w/o success. This n told to floor RN will call back.

## 2019-01-03 ENCOUNTER — Inpatient Hospital Stay: Payer: Medicare Other

## 2019-01-03 LAB — BASIC METABOLIC PANEL
Anion gap: 12 (ref 5–15)
BUN: 36 mg/dL — ABNORMAL HIGH (ref 8–23)
CO2: 26 mmol/L (ref 22–32)
Calcium: 9.4 mg/dL (ref 8.9–10.3)
Chloride: 96 mmol/L — ABNORMAL LOW (ref 98–111)
Creatinine, Ser: 1.08 mg/dL (ref 0.61–1.24)
GFR calc Af Amer: >60 mL/min (ref >60)
GFR calc non Af Amer: >60 mL/min (ref >60)
Glucose, Bld: 121 mg/dL — ABNORMAL HIGH (ref 70–99)
Potassium: 5.2 mmol/L — ABNORMAL HIGH (ref 3.5–5.1)
Sodium: 134 mmol/L — ABNORMAL LOW (ref 135–145)

## 2019-01-03 LAB — CBC
HCT: 50.2 % (ref 39.0–52.0)
Hemoglobin: 16.4 g/dL (ref 13.0–17.0)
MCH: 31.9 pg (ref 26.0–34.0)
MCHC: 32.7 g/dL (ref 30.0–36.0)
MCV: 97.7 fL (ref 80.0–100.0)
Platelets: 188 10*3/uL (ref 150–400)
RBC: 5.14 MIL/uL (ref 4.22–5.81)
RDW: 15.2 % (ref 11.5–15.5)
WBC: 29.2 10*3/uL — ABNORMAL HIGH (ref 4.0–10.5)
nRBC: 0 % (ref 0.0–0.2)

## 2019-01-03 LAB — ECHOCARDIOGRAM COMPLETE: Weight: 3731.95 oz

## 2019-01-03 LAB — APTT
aPTT: 72 seconds — ABNORMAL HIGH (ref 24–36)
aPTT: 62 seconds — ABNORMAL HIGH (ref 24–36)

## 2019-01-03 LAB — HEPARIN LEVEL (UNFRACTIONATED)
Heparin Unfractionated: 0.67 IU/mL (ref 0.30–0.70)
Heparin Unfractionated: 0.52 IU/mL (ref 0.30–0.70)

## 2019-01-03 MED ORDER — SODIUM ZIRCONIUM CYCLOSILICATE 5 G PO PACK
5.0000 g | PACK | Freq: Once | ORAL | Status: AC
  Administered 2019-01-03: 12:00:00 5 g via ORAL
  Filled 2019-01-03: qty 1

## 2019-01-03 MED ORDER — FLUCONAZOLE 100 MG PO TABS
100.0000 mg | ORAL_TABLET | Freq: Every day | ORAL | Status: DC
  Administered 2019-01-03 – 2019-01-05 (×3): 100 mg via ORAL
  Filled 2019-01-03 (×3): qty 1

## 2019-01-03 MED ORDER — HYDROMORPHONE HCL 1 MG/ML IJ SOLN
1.0000 mg | INTRAMUSCULAR | Status: DC | PRN
  Administered 2019-01-03: 1 mg via INTRAVENOUS
  Filled 2019-01-03: qty 1

## 2019-01-03 NOTE — Progress Notes (Addendum)
[facility] [facility] [place] NAME: Ricky Petersen    MR#:  [id number]  DATE OF BIRTH:  March 25, 1946  SUBJECTIVE:   Patient states he is feeling better this morning.  He is still endorsing some right-sided chest pain.  He also endorses abdominal "soreness" every time he coughs.  He states he feels like he cannot really take a deep breath due to the right-sided chest pain.  REVIEW OF SYSTEMS:  Review of Systems  Constitutional: Negative for chills and fever.  HENT: Negative for congestion and sore throat.   Eyes: Negative for blurred vision and double vision.  Respiratory: Positive for shortness of breath. Negative for cough.   Cardiovascular: Positive for chest pain. Negative for palpitations and leg swelling.  Gastrointestinal: Negative for nausea and vomiting.  Genitourinary: Negative for dysuria and urgency.  Musculoskeletal: Negative for back pain and neck pain.  Neurological: Negative for dizziness and headaches.  Psychiatric/Behavioral: Negative for depression. The patient is not nervous/anxious.     DRUG ALLERGIES:  No Known Allergies VITALS:  Blood pressure 120/71, pulse 94, temperature 97.6 F (36.4 C), temperature source Oral, resp. rate 19, weight 105.8 kg, SpO2 93 %. PHYSICAL EXAMINATION:  Physical Exam  GENERAL:   Sitting up in the chair, in no acute distress.  Pleasant and conversational. HEENT: Head atraumatic, normocephalic. Pupils equal, round, reactive to light and accommodation. No scleral icterus. Extraocular muscles intact. Oropharynx and nasopharynx clear.  NECK:  Supple, no jugular venous distention. No thyroid enlargement. LUNGS: No use of accessory muscles of respiration. + Taking somewhat shallow breaths. + Crackles present in the right lung base.  Nasal cannula in place.  Able to speak in full sentences. CARDIOVASCULAR: RRR, S1, S2 normal. No murmurs, rubs, or gallops.  ABDOMEN: Soft, nontender, nondistended. Bowel  sounds present.  EXTREMITIES: No pedal edema, cyanosis, or clubbing.  NEUROLOGIC: CN 2-12 intact, no focal deficits. 5/5 muscle strength throughout all extremities. Sensation intact throughout. Gait not checked.  PSYCHIATRIC: The patient is alert and oriented x 3.  SKIN: No obvious rash, lesion, or ulcer.  LABORATORY PANEL:  Male CBC Recent Labs  Lab 01/03/19 0519  WBC 29.2*  HGB 16.4  HCT 50.2  PLT 188   ------------------------------------------------------------------------------------------------------------------ Chemistries  Recent Labs  Lab 01/03/19 0519  NA 134*  K 5.2*  CL 96*  CO2 26  GLUCOSE 121*  BUN 36*  CREATININE 1.08  CALCIUM 9.4   RADIOLOGY:  US Venous Img Lower Bilateral  Result Date: 01/03/2019 CLINICAL DATA:  Pulmonary embolism. EXAM: BILATERAL LOWER EXTREMITY VENOUS DOPPLER ULTRASOUND TECHNIQUE: Gray-scale sonography with graded compression, as well as color Doppler and duplex ultrasound were performed to evaluate the lower extremity deep venous systems from the level of the common femoral vein and including the common femoral, femoral, profunda femoral, popliteal and calf veins including the posterior tibial, peroneal and gastrocnemius veins when visible. The superficial great saphenous vein was also interrogated. Spectral Doppler was utilized to evaluate flow at rest and with distal augmentation maneuvers in the common femoral, femoral and popliteal veins. COMPARISON:  None. FINDINGS: RIGHT LOWER EXTREMITY Common Femoral Vein: No evidence of thrombus. Normal compressibility, respiratory phasicity and response to augmentation. Saphenofemoral Junction: No evidence of thrombus. Normal compressibility and flow on color Doppler imaging. Profunda Femoral Vein: No evidence of thrombus. Normal compressibility and flow on color Doppler imaging. Femoral Vein: No evidence of thrombus. Normal compressibility, respiratory phasicity and response to augmentation. Popliteal  Vein: No evidence of thrombus.  Normal compressibility, respiratory phasicity and response to augmentation. Calf Veins: No evidence of thrombus. Normal compressibility and flow on color Doppler imaging. Superficial Great Saphenous Vein: No evidence of thrombus. Normal compressibility. Venous Reflux:  None. Other Findings:  None. LEFT LOWER EXTREMITY Common Femoral Vein: No evidence of thrombus. Normal compressibility, respiratory phasicity and response to augmentation. Saphenofemoral Junction: No evidence of thrombus. Normal compressibility and flow on color Doppler imaging. Profunda Femoral Vein: No evidence of thrombus. Normal compressibility and flow on color Doppler imaging. Femoral Vein: No evidence of thrombus. Normal compressibility, respiratory phasicity and response to augmentation. Popliteal Vein: No evidence of thrombus. Normal compressibility, respiratory phasicity and response to augmentation. Calf Veins: No evidence of thrombus. Normal compressibility and flow on color Doppler imaging. Superficial Great Saphenous Vein: No evidence of thrombus. Normal compressibility. Venous Reflux:  None. Other Findings:  None. IMPRESSION: No evidence of deep venous thrombosis in either lower extremity. Electronically Signed   By: Constance Holster M.D.   On: 01/03/2019 10:14   ASSESSMENT AND PLAN:   Acute hypoxic respiratory failure secondary to acute pulmonary embolism and recent COVID-19 infection. -Will continue heparin drip for another 24 hours and plan to transition to Eliquis tomorrow -Check bilateral lower extremity duplex ultrasounds to rule out DVT -Continue home prednisone -Cardiac monitoring -Wean O2 as able -Albuterol PRN -Incentive spirometry ordered  Leukocytosis- likely due to steroids. -Chest x-ray without any evidence of bacterial pneumonia -Recheck CBC in the morning  Hyperkalemia- may be due to ARB -Will give a dose of Lokelma today -Holding home losartan -Recheck potassium in  the morning  Hyponatremia- slightly improved. -Holding home hydrochlorothiazide -Recheck sodium in the morning  Hypertension- normotensive this morning.   -Holding home losartan and HCTZ -Restart home metoprolol today  OSA- stable -CPAP qhs  All the records are reviewed and case discussed with Care Management/Social Worker. Management plans discussed with the patient, family and they are in agreement.  CODE STATUS: Full Code  TOTAL TIME TAKING CARE OF THIS PATIENT: 40 minutes.   More than 50% of the time was spent in counseling/coordination of care: YES  POSSIBLE D/C IN 1-2 DAYS, DEPENDING ON CLINICAL CONDITION.   [name] Mayo M.D on 01/03/2019 at 12:02 PM  Between 7am to 6pm - Pager - [phone]  After 6pm go to [email or web address] - Proofreader  [facility]  Office  [phone]  CC: Primary care physician; Tracie Harrier, MD  Note: This dictation was prepared with Dragon dictation along with smaller phrase technology. Any transcriptional errors that result from this process are unintentional.

## 2019-01-03 NOTE — Progress Notes (Signed)
ANTICOAGULATION CONSULT NOTE - Initial Consult  Pharmacy Consult for Heparin f Indication: ACS /  PE   No Known Allergies  Patient Measurements: Weight: 233 lb 4 oz (105.8 kg) Heparin Dosing Weight: 105   Vital Signs: Temp: 97.5 F (36.4 C) (10/07 0421) Temp Source: Oral (10/07 0421) BP: 133/94 (10/07 0421) Pulse Rate: 77 (10/07 0421)  Labs: Recent Labs    01/01/19 1653 01/01/19 1843  01/02/19 0356 01/02/19 0813 01/02/19 1341 01/02/19 1921 01/02/19 2147 01/03/19 0519  HGB 15.7  --   --   --  16.6  --   --   --  16.4  HCT 46.0  --   --   --  49.1  --   --   --  50.2  PLT 219  --   --   --  228  --   --   --  188  APTT  --   --    < > 117*  --  89*  --  89* 72*  LABPROT  --   --   --  13.8  --   --   --   --   --   INR  --   --   --  1.1  --   --   --   --   --   HEPARINUNFRC  --   --   --  1.22*  --  1.72*  --   --  0.67  CREATININE  --  0.84  --   --   --   --   --   --  1.08  TROPONINIHS  --  11  --   --   --   --  10  --   --    < > = values in this interval not displayed.    Estimated Creatinine Clearance: 80.1 mL/min (by C-G formula based on SCr of 1.08 mg/dL).   Medical History: Past Medical History:  Diagnosis Date  . COVID-19   . GERD (gastroesophageal reflux disease)   . Hypertension   . Idiopathic cardiomyopathy ([place])   . Sleep apnea     Medications:  Medications Prior to Admission  Medication Sig Dispense Refill Last Dose  . aspirin EC 81 MG tablet Take 81 mg by mouth daily.   01/01/2019 at 1200  . fluticasone (FLONASE) 50 MCG/ACT nasal spray Place 2 sprays into both nostrils daily.   01/01/2019 at 1200  . hydrochlorothiazide (HYDRODIURIL) 25 MG tablet Take 25 mg by mouth daily.   01/01/2019 at 1200  . losartan (COZAAR) 100 MG tablet Take 150 mg by mouth daily.    01/01/2019 at 1200  . metoprolol succinate (TOPROL-XL) 50 MG 24 hr tablet Take 50 mg by mouth daily.   01/01/2019 at 1200  . omeprazole (PRILOSEC) 20 MG capsule Take 20 mg by mouth 2 (two)  times daily before a meal.   01/01/2019 at 1200  . predniSONE (DELTASONE) 10 MG tablet Take 4 tablets (40 mg total) by mouth daily for 3 days, THEN 3 tablets (30 mg total) daily for 3 days, THEN 2 tablets (20 mg total) daily for 3 days, THEN 1 tablet (10 mg total) daily for 3 days. 30 tablet 0 01/01/2019 at 1200    Assessment: Pharmacy consulted for ACS. Trop unimpressive. CT chest: Findings compatible with acute pulmonary embolus predominately involving the right lower lobe segmental and subsegmental pulmonary arteries. No DOAC PTA.   10/6 0356 APTT 117 and HL 1.22  held for 1 hour and decreased to 1300.  10/6 1341 APTT 89  Goal of Therapy:  Heparin level 0.3-0.7 units/ml Monitor platelets by anticoagulation protocol: Yes   Plan:  10/7 @ 0500 HL 0.62, aPTT 72 seconds therapeutic. Will continue current rate and will recheck HL w/ am labs considering both levels are correlating, CBC stable will continue to monitor.  Tobie Lords, PharmD, BCPS Clinical Pharmacist 01/03/2019,6:43 AM

## 2019-01-03 NOTE — Progress Notes (Addendum)
ANTICOAGULATION CONSULT NOTE - Follow up [place] for Heparin  Indication: ACS /  PE   No Known Allergies  Patient Measurements: Weight: 233 lb 4 oz (105.8 kg) Heparin Dosing Weight: 105   Vital Signs: Temp: 97.8 F (36.6 C) (10/07 1629) Temp Source: Oral (10/07 1629) BP: 119/87 (10/07 1629) Pulse Rate: 90 (10/07 1629)  Labs: Recent Labs    01/01/19 1653 01/01/19 1843  01/02/19 0356 01/02/19 0813 01/02/19 1341 01/02/19 1921 01/02/19 2147 01/03/19 0519 01/03/19 1641  HGB 15.7  --   --   --  16.6  --   --   --  16.4  --   HCT 46.0  --   --   --  49.1  --   --   --  50.2  --   PLT 219  --   --   --  228  --   --   --  188  --   APTT  --   --    < > 117*  --  89*  --  89* 72* 62*  LABPROT  --   --   --  13.8  --   --   --   --   --   --   INR  --   --   --  1.1  --   --   --   --   --   --   HEPARINUNFRC  --   --    < > 1.22*  --  1.72*  --   --  0.67 0.52  CREATININE  --  0.84  --   --   --   --   --   --  1.08  --   TROPONINIHS  --  11  --   --   --   --  10  --   --   --    < > = values in this interval not displayed.    Estimated Creatinine Clearance: 80.1 mL/min (by C-G formula based on SCr of 1.08 mg/dL).   Assessment: Pharmacy consulted for heparin dosing for ACS but outpt CTA findings compatible with acute pulmonary embolus predominately involving the right lower lobe segmental and subsegmental pulmonary arteries. No DOAC PTA.   10/6 0356 APTT 117 and HL 1.22 held for 1 hour and decreased to 1300.  10/6 1341 APTT 89 10/6 2147 aPTT 89 10/7 0519 aPTT 72, HL 0.67 10/7 1641 aPTT 62, HL 0.52 increase rate to   Goal of Therapy:  Heparin level 0.3-0.7 units/ml APTT 66-102 s Monitor platelets by anticoagulation protocol: Yes   Plan:  APTT subtherapeutic, I do not believe heparin level and aPTT are correlating. Will increase rate to 1400 units/hr, no bolus. Re-check aPTT/HL/CBC at 0100.  CBC stable will continue to monitor.   Pharmacy will  continue to follow.   Eleonore Chiquito, PharmD, BCPS Clinical Pharmacist 01/03/2019 6:03 PM

## 2019-01-03 NOTE — Progress Notes (Signed)
ANTICOAGULATION CONSULT NOTE - Follow up [place] for Heparin f Indication: ACS /  PE   No Known Allergies  Patient Measurements: Weight: 233 lb 4 oz (105.8 kg) Heparin Dosing Weight: 105   Vital Signs: Temp: 97.5 F (36.4 C) (10/07 0421) Temp Source: Oral (10/07 0421) BP: 133/94 (10/07 0421) Pulse Rate: 77 (10/07 0421)  Labs: Recent Labs    01/01/19 1653 01/01/19 1843  01/02/19 0356 01/02/19 0813 01/02/19 1341 01/02/19 1921 01/02/19 2147 01/03/19 0519  HGB 15.7  --   --   --  16.6  --   --   --  16.4  HCT 46.0  --   --   --  49.1  --   --   --  50.2  PLT 219  --   --   --  228  --   --   --  188  APTT  --   --    < > 117*  --  89*  --  89* 72*  LABPROT  --   --   --  13.8  --   --   --   --   --   INR  --   --   --  1.1  --   --   --   --   --   HEPARINUNFRC  --   --   --  1.22*  --  1.72*  --   --  0.67  CREATININE  --  0.84  --   --   --   --   --   --  1.08  TROPONINIHS  --  11  --   --   --   --  10  --   --    < > = values in this interval not displayed.    Estimated Creatinine Clearance: 80.1 mL/min (by C-G formula based on SCr of 1.08 mg/dL).   Assessment: Pharmacy consulted for heparin dosing for ACS but outpt CTA findings compatible with acute pulmonary embolus predominately involving the right lower lobe segmental and subsegmental pulmonary arteries. No DOAC PTA.   10/6 0356 APTT 117 and HL 1.22 held for 1 hour and decreased to 1300.  10/6 1341 APTT 89 10/6 2147 aPTT 89 10/7 0519 aPTT 72, HL 0.67  Goal of Therapy:  Heparin level 0.3-0.7 units/ml APTT 66-102 s Monitor platelets by anticoagulation protocol: Yes   Plan:  10/7 @ 0500 HL 0.67, aPTT 72 seconds therapeutic. (aPTT low end of range and HL upper end of range, not yet correlating?) Will continue current rate and will recheck HL and aPTT in about 12h at 1700 to confirm labs still therapeutic as aPTT is trending down and pt with PE. CBC stable will continue to monitor. CBC  in AM  Pharmacy will continue to follow.   Rayna Sexton, PharmD, BCPS Clinical Pharmacist 01/03/2019 8:12 AM

## 2019-01-04 LAB — CBC
HCT: 45 % (ref 39.0–52.0)
Hemoglobin: 15.2 g/dL (ref 13.0–17.0)
MCH: 32.3 pg (ref 26.0–34.0)
MCHC: 33.8 g/dL (ref 30.0–36.0)
MCV: 95.5 fL (ref 80.0–100.0)
Platelets: 141 10*3/uL — ABNORMAL LOW (ref 150–400)
RBC: 4.71 MIL/uL (ref 4.22–5.81)
RDW: 14.9 % (ref 11.5–15.5)
WBC: 26.3 10*3/uL — ABNORMAL HIGH (ref 4.0–10.5)
nRBC: 0 % (ref 0.0–0.2)

## 2019-01-04 LAB — BASIC METABOLIC PANEL
Anion gap: 11 (ref 5–15)
BUN: 47 mg/dL — ABNORMAL HIGH (ref 8–23)
CO2: 25 mmol/L (ref 22–32)
Calcium: 9.5 mg/dL (ref 8.9–10.3)
Chloride: 93 mmol/L — ABNORMAL LOW (ref 98–111)
Creatinine, Ser: 1.14 mg/dL (ref 0.61–1.24)
GFR calc Af Amer: >60 mL/min (ref >60)
GFR calc non Af Amer: >60 mL/min (ref >60)
Glucose, Bld: 105 mg/dL — ABNORMAL HIGH (ref 70–99)
Potassium: 4.9 mmol/L (ref 3.5–5.1)
Sodium: 129 mmol/L — ABNORMAL LOW (ref 135–145)

## 2019-01-04 LAB — APTT: aPTT: 77 seconds — ABNORMAL HIGH (ref 24–36)

## 2019-01-04 LAB — HEPARIN LEVEL (UNFRACTIONATED): Heparin Unfractionated: 0.54 IU/mL (ref 0.30–0.70)

## 2019-01-04 MED ORDER — APIXABAN 5 MG PO TABS: 5.0000 mg | ORAL_TABLET | Freq: Two times a day (BID) | ORAL | Status: DC

## 2019-01-04 MED ORDER — METOPROLOL SUCCINATE ER 50 MG PO TB24
50.0000 mg | ORAL_TABLET | Freq: Every day | ORAL | Status: DC
  Administered 2019-01-05: 11:00:00 50 mg via ORAL
  Filled 2019-01-04: qty 1

## 2019-01-04 MED ORDER — POLYETHYLENE GLYCOL 3350 17 G PO PACK
17.0000 g | PACK | Freq: Every day | ORAL | Status: DC
  Administered 2019-01-04 – 2019-01-05 (×2): 17 g via ORAL
  Filled 2019-01-04 (×2): qty 1

## 2019-01-04 MED ORDER — SODIUM CHLORIDE 0.9 % IV SOLN
INTRAVENOUS | Status: DC
  Administered 2019-01-04: 23:00:00 via INTRAVENOUS

## 2019-01-04 MED ORDER — APIXABAN 5 MG PO TABS
10.0000 mg | ORAL_TABLET | Freq: Two times a day (BID) | ORAL | Status: DC
  Administered 2019-01-04 – 2019-01-05 (×3): 10 mg via ORAL
  Filled 2019-01-04 (×3): qty 2

## 2019-01-04 NOTE — Progress Notes (Signed)
ANTICOAGULATION CONSULT NOTE - Follow up [place] for Heparin  Indication: ACS /  PE   No Known Allergies  Patient Measurements: Weight: 233 lb 4 oz (105.8 kg) Heparin Dosing Weight: 105   Vital Signs: Temp: 97.8 F (36.6 C) (10/07 1958) Temp Source: Oral (10/07 1958) BP: 123/88 (10/07 1958) Pulse Rate: 97 (10/07 1958)  Labs: Recent Labs    01/01/19 1843 01/02/19 0356 01/02/19 0813  01/02/19 1921  01/03/19 0519 01/03/19 1641 01/04/19 0100  HGB  --   --  16.6  --   --   --  16.4  --  15.2  HCT  --   --  49.1  --   --   --  50.2  --  45.0  PLT  --   --  228  --   --   --  188  --  141*  APTT  --  117*  --    < >  --    < > 72* 62* 77*  LABPROT  --  13.8  --   --   --   --   --   --   --   INR  --  1.1  --   --   --   --   --   --   --   HEPARINUNFRC  --  1.22*  --    < >  --   --  0.67 0.52 0.54  CREATININE 0.84  --   --   --   --   --  1.08  --   --   TROPONINIHS 11  --   --   --  10  --   --   --   --    < > = values in this interval not displayed.    Estimated Creatinine Clearance: 80.1 mL/min (by C-G formula based on SCr of 1.08 mg/dL).   Assessment: Pharmacy consulted for heparin dosing for ACS but outpt CTA findings compatible with acute pulmonary embolus predominately involving the right lower lobe segmental and subsegmental pulmonary arteries. No DOAC PTA.   10/6 0356 APTT 117 and HL 1.22 held for 1 hour and decreased to 1300.  10/6 1341 APTT 89 10/6 2147 aPTT 89 10/7 0519 aPTT 72, HL 0.67 10/7 1641 aPTT 62, HL 0.52 increase rate to   Goal of Therapy:  Heparin level 0.3-0.7 units/ml APTT 66-102 s Monitor platelets by anticoagulation protocol: Yes   Plan:  1008 @ 0100 HL 0.54, aPTT 77 seconds both therapeutic and correlating. Will dose based off of anti-Xa from now on. Will continue current rate and will recheck anti-Xa w/ am labs 10/09, CBC grossly stable except for a gradual decrease in plts to 141 which is a > 60% drop from  baseline. Will continue to monitor.  Tobie Lords, PharmD, BCPS Clinical Pharmacist 01/04/2019 3:03 AM

## 2019-01-04 NOTE — Progress Notes (Signed)
[place] at [place]: Ricky Petersen    MR#:  [id number]  DATE OF BIRTH:  [birth year]  SUBJECTIVE:   Patient states he is feeling less short of breath today. His pain is also better, but is still persistent. No cough, fevers, or chills.  REVIEW OF SYSTEMS:  Review of Systems  Constitutional: Negative for chills and fever.  HENT: Negative for congestion and sore throat.   Eyes: Negative for blurred vision and double vision.  Respiratory: Positive for shortness of breath. Negative for cough.   Cardiovascular: Positive for chest pain. Negative for palpitations and leg swelling.  Gastrointestinal: Negative for nausea and vomiting.  Genitourinary: Negative for dysuria and urgency.  Musculoskeletal: Negative for back pain and neck pain.  Neurological: Negative for dizziness and headaches.  Psychiatric/Behavioral: Negative for depression. The patient is not nervous/anxious.     DRUG ALLERGIES:  No Known Allergies VITALS:  Blood pressure 112/90, pulse 63, temperature 97.7 F (36.5 C), temperature source Oral, resp. rate 19, weight 105.8 kg, SpO2 91 %. PHYSICAL EXAMINATION:  Physical Exam  GENERAL:   Sitting up in the chair, in no acute distress.  Pleasant and conversational. HEENT: Head atraumatic, normocephalic. Pupils equal, round, reactive to light and accommodation. No scleral icterus. Extraocular muscles intact. Oropharynx and nasopharynx clear.  NECK:  Supple, no jugular venous distention. No thyroid enlargement. LUNGS: No use of accessory muscles of respiration. + Taking somewhat shallow breaths. Lungs are clear to auscultation bilaterally.  Nasal cannula in place.  Able to speak in full sentences. CARDIOVASCULAR: RRR, S1, S2 normal. No murmurs, rubs, or gallops.  ABDOMEN: Soft, nontender, nondistended. Bowel sounds present.  EXTREMITIES: No pedal edema, cyanosis, or clubbing.  NEUROLOGIC: CN 2-12 intact, no focal deficits. 5/5  muscle strength throughout all extremities. Sensation intact throughout. Gait not checked.  PSYCHIATRIC: The patient is alert and oriented x 3.  SKIN: No obvious rash, lesion, or ulcer.  LABORATORY PANEL:  Male CBC Recent Labs  Lab 01/04/19 0100  WBC 26.3*  HGB 15.2  HCT 45.0  PLT 141*   ------------------------------------------------------------------------------------------------------------------ Chemistries  Recent Labs  Lab 01/04/19 0616  NA 129*  K 4.9  CL 93*  CO2 25  GLUCOSE 105*  BUN 47*  CREATININE 1.14  CALCIUM 9.5   RADIOLOGY:  No results found. ASSESSMENT AND PLAN:   Acute hypoxic respiratory failure secondary to acute pulmonary embolism and recent COVID-19 infection. On 3L O2 this morning. -Has been on heparin gtt for 48 hours- will transition to eliquis today -Bilateral lower extremity duplex ultrasounds were negative for DVT -Completed course of steroids today -Cardiac monitoring -Wean O2 as able -Albuterol PRN -Incentive spirometry -Pain control  Leukocytosis- likely due to steroids. Improving. -Chest x-ray without any evidence of bacterial pneumonia -Recheck CBC in the morning  Hyponatremia- sodium remains low. Patient does not appear volume overloaded. -Will start some gentle IVFs today -Holding home hydrochlorothiazide -Recheck sodium in the morning  Hypertension- normotensive this morning.   -Holding home losartan and HCTZ -Restart home metoprolol today  OSA- stable -CPAP qhs  All the records are reviewed and case discussed with Care Management/Social Worker. Management plans discussed with the patient, family and they are in agreement.  CODE STATUS: Full Code  TOTAL TIME TAKING CARE OF THIS PATIENT: 35 minutes.   More than 50% of the time was spent in counseling/coordination of care: YES  POSSIBLE D/C IN 1-2 DAYS, DEPENDING ON CLINICAL CONDITION.   Ricky Petersen  M.D on 01/04/2019 at 10:41 AM  Between 7am to 6pm - Pager -  [phone]  After 6pm go to [email or web address] - Proofreader  [facility]  Office  [phone]  CC: Primary care physician; Tracie Harrier, MD  Note: This dictation was prepared with Dragon dictation along with smaller phrase technology. Any transcriptional errors that result from this process are unintentional.

## 2019-01-04 NOTE — Progress Notes (Signed)
ANTICOAGULATION CONSULT NOTE - Initial Consult  Pharmacy Consult for Apixaban Indication: pulmonary embolus  No Known Allergies  Patient Measurements: Weight: 233 lb 4 oz (105.8 kg) Heparin Dosing Weight:    Vital Signs: Temp: 97.7 F (36.5 C) (10/08 0413) Temp Source: Oral (10/08 0413) BP: 112/90 (10/08 0413) Pulse Rate: 63 (10/08 0413)  Labs: Recent Labs    01/01/19 1843 01/02/19 0356 01/02/19 0813  01/02/19 1921  01/03/19 0519 01/03/19 1641 01/04/19 0100 01/04/19 0616  HGB  --   --  16.6  --   --   --  16.4  --  15.2  --   HCT  --   --  49.1  --   --   --  50.2  --  45.0  --   PLT  --   --  228  --   --   --  188  --  141*  --   APTT  --  117*  --    < >  --    < > 72* 62* 77*  --   LABPROT  --  13.8  --   --   --   --   --   --   --   --   INR  --  1.1  --   --   --   --   --   --   --   --   HEPARINUNFRC  --  1.22*  --    < >  --   --  0.67 0.52 0.54  --   CREATININE 0.84  --   --   --   --   --  1.08  --   --  1.14  TROPONINIHS 11  --   --   --  10  --   --   --   --   --    < > = values in this interval not displayed.    Estimated Creatinine Clearance: 75.9 mL/min (by C-G formula based on SCr of 1.14 mg/dL).   Medical History: Past Medical History:  Diagnosis Date  . COVID-19   . GERD (gastroesophageal reflux disease)   . Hypertension   . Idiopathic cardiomyopathy ([place])   . Sleep apnea     Medications:  Scheduled:  . apixaban  10 mg Oral BID   Followed by  . [START ON 01/11/2019] apixaban  5 mg Oral BID  . aspirin EC  81 mg Oral Daily  . fluconazole  100 mg Oral Daily  . fluticasone  2 spray Each Nare Daily  . pantoprazole  40 mg Oral Daily  . predniSONE  10 mg Oral Q breakfast  . sodium chloride flush  3 mL Intravenous Q12H   Infusions:  . sodium chloride      Assessment: 73 yo male admitted with PE now transitioning from Heparin drip to apixaban. Hospitalized 9/21-9/26 with Covid-19 PNA at [facility]  Goal of Therapy:   Monitor platelets by anticoagulation protocol: Yes   Plan:  Will begin apixaban 10 mg po BID x 7 days then 5 mg BID for PE treatment. Heparin drip discontinued Will f/u CBC/Scr per protocol  Ricky Petersen A 01/04/2019,8:07 AM

## 2019-01-05 LAB — BASIC METABOLIC PANEL
Anion gap: 9 (ref 5–15)
BUN: 39 mg/dL — ABNORMAL HIGH (ref 8–23)
CO2: 25 mmol/L (ref 22–32)
Calcium: 8.7 mg/dL — ABNORMAL LOW (ref 8.9–10.3)
Chloride: 94 mmol/L — ABNORMAL LOW (ref 98–111)
Creatinine, Ser: 0.92 mg/dL (ref 0.61–1.24)
GFR calc Af Amer: >60 mL/min (ref >60)
GFR calc non Af Amer: >60 mL/min (ref >60)
Glucose, Bld: 108 mg/dL — ABNORMAL HIGH (ref 70–99)
Potassium: 4.4 mmol/L (ref 3.5–5.1)
Sodium: 128 mmol/L — ABNORMAL LOW (ref 135–145)

## 2019-01-05 LAB — CBC
HCT: 42.3 % (ref 39.0–52.0)
Hemoglobin: 14.4 g/dL (ref 13.0–17.0)
MCH: 32.3 pg (ref 26.0–34.0)
MCHC: 34 g/dL (ref 30.0–36.0)
MCV: 94.8 fL (ref 80.0–100.0)
Platelets: 108 10*3/uL — ABNORMAL LOW (ref 150–400)
RBC: 4.46 MIL/uL (ref 4.22–5.81)
RDW: 15 % (ref 11.5–15.5)
WBC: 18 10*3/uL — ABNORMAL HIGH (ref 4.0–10.5)
nRBC: 0 % (ref 0.0–0.2)

## 2019-01-05 MED ORDER — ELIQUIS 5 MG VTE STARTER PACK: ORAL_TABLET | ORAL | 0 refills | Status: DC

## 2019-01-05 MED ORDER — HYDROCODONE-ACETAMINOPHEN 5-325 MG PO TABS: 1.0000 | ORAL_TABLET | Freq: Three times a day (TID) | ORAL | 0 refills | Status: DC | PRN

## 2019-01-05 NOTE — Progress Notes (Signed)
Advance care planning  Purpose of Encounter Pulmonary embolism  Parties in Attendance Patient  Patients Decisional capacity Alert and oriented and able to make medical decisions  NO Living will or ACP documents available.  Primary contact is wife.  Discussed in detail regarding pulmonary embolism.  Treatment plan , prognosis discussed.  All questions answered  Code status - FULL CODE   Time spent - 17 minutes

## 2019-01-05 NOTE — Discharge Instructions (Signed)
Activity as tolerated with no exertion or lifting heavy weight.  You will need repeat Sodium blood level checked in 4 to 5 days with your primary care physician.

## 2019-01-05 NOTE — Care Management Important Message (Signed)
Important Message  Patient Details  Name: RANDOL BERMEL MRN: [id number] Date of Birth: 1945-09-06   Medicare Important Message Given:  Yes     Dannette Barbara 01/05/2019, 11:35 AM

## 2019-01-05 NOTE — Discharge Summary (Signed)
[place] [facility] [place] NAME: Ricky Petersen    MR#:  [id number]  DATE OF BIRTH:  1945-05-18  DATE OF ADMISSION:  01/01/2019 ADMITTING PHYSICIAN: Demetrios Loll, MD  DATE OF DISCHARGE: 01/05/2019  1:49 PM  PRIMARY CARE PHYSICIAN: Tracie Harrier, MD   ADMISSION DIAGNOSIS:  Pulmonary infarct ([place]) [I26.99] Other acute pulmonary embolism, unspecified whether acute cor pulmonale present ([place]) [I26.99] Pulmonary embolism ([place]) [I26.99]  DISCHARGE DIAGNOSIS:  Active Problems:   Pulmonary embolism ([place])   SECONDARY DIAGNOSIS:   Past Medical History:  Diagnosis Date  . COVID-19   . GERD (gastroesophageal reflux disease)   . Hypertension   . Idiopathic cardiomyopathy ([place])   . Sleep apnea      ADMITTING HISTORY  HISTORY OF PRESENT ILLNESS:  Ricky Petersen  is a 73 y.o. male with a known history of recent COVID-19 pneumonia, hypertension, idiopathic cardiomyopathy, sleep apnea and GERD.  The patient presents the ED with above chief complaints.  He has been recently treated with steroid and completed antiviral course for COVID-19 pneumonia in [facility].  He was on oxygen during hospitalization and was discharged without oxygen.  He has had worsening right-sided chest pain for the past 6 days.  In addition, he has shortness of breath and some cough but no hemoptysis.  He got CT angiogram of the chest as outpatient and found PE.  He is sent to ED for further treatment.  Heparin drip is started.  ED physician request admission.   HOSPITAL COURSE:   Acute hypoxic respiratory failure secondary to acute pulmonary embolism and recent COVID-19 infection. - Admitted to Medical floor with tele monitoring. Started on heparin drip and on day of transitioned to Eliquis. Prescription for starter kit given. No bleeding. Initially on 3 L O2 and then weaned off slowly. -Bilateral lower extremity duplex ultrasounds were negative for DVT -Completed course  of steroids  From home -Cardiac monitoring with no arrhythmias Weaned off O2 by day of discharge and ambulated on room air without issues.   Leukocytosis- likely due to steroids. Improving. 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 Home HCTZ stopped. Mildly low at 128. Advised fluid restriction and hold HCTZ. F/U with PCP in 4-5 days for repeat sodium check,  Hypertension- normotensive .  -Holding home HCTZ due to hyponatremia  OSA- stable -CPAP qhs  Stable for discharge home  CONSULTS OBTAINED:  Treatment Team:  Hillary Bow, MD  DRUG ALLERGIES:  No Known Allergies  DISCHARGE MEDICATIONS:   Allergies as of 01/05/2019   No Known Allergies     Medication List    STOP taking these medications   hydrochlorothiazide 25 MG tablet Commonly known as: HYDRODIURIL   predniSONE 10 MG tablet Commonly known as: DELTASONE     TAKE these medications   aspirin EC 81 MG tablet Take 81 mg by mouth daily.   Eliquis DVT/PE Starter Pack 5 MG Tabs Take as directed on package: start with two-45m tablets twice daily for 7 days. On day 8, switch to one-552mtablet twice daily.   fluticasone 50 MCG/ACT nasal spray Commonly known as: FLONASE Place 2 sprays into both nostrils daily.   HYDROcodone-acetaminophen 5-325 MG tablet Commonly known as: NORCO/VICODIN Take 1 tablet by mouth 3 (three) times daily as needed for severe pain.   losartan 100 MG tablet Commonly known as: COZAAR Take 150 mg by mouth daily.  metoprolol succinate 50 MG 24 hr tablet Commonly known as: TOPROL-XL Take 50 mg by mouth daily.   omeprazole 20 MG capsule Commonly known as: PRILOSEC Take 20 mg by mouth 2 (two) times daily before a meal.       Today   VITAL SIGNS:  Blood pressure 112/81, pulse 72, temperature 97.6 F (36.4 C), temperature source Oral, resp. rate 18, weight 105.8 kg, SpO2 91  %.  I/O:    Intake/Output Summary (Last 24 hours) at 01/05/2019 2034 Last data filed at 01/05/2019 1006 Gross per 24 hour  Intake 587.62 ml  Output 600 ml  Net -12.38 ml    PHYSICAL EXAMINATION:  Physical Exam  GENERAL:  73 y.o.-year-old patient lying in the bed with no acute distress.  LUNGS: Normal breath sounds bilaterally, no wheezing, rales,rhonchi or crepitation. No use of accessory muscles of respiration.  CARDIOVASCULAR: S1, S2 normal. No murmurs, rubs, or gallops.  ABDOMEN: Soft, non-tender, non-distended. Bowel sounds present. No organomegaly or mass.  NEUROLOGIC: Moves all 4 extremities. PSYCHIATRIC: The patient is alert and oriented x 3.  SKIN: No obvious rash, lesion, or ulcer.   DATA REVIEW:   CBC Recent Labs  Lab 01/05/19 0557  WBC 18.0*  HGB 14.4  HCT 42.3  PLT 108*    Chemistries  Recent Labs  Lab 01/05/19 0557  NA 128*  K 4.4  CL 94*  CO2 25  GLUCOSE 108*  BUN 39*  CREATININE 0.92  CALCIUM 8.7*    Cardiac Enzymes No results for input(s): TROPONINI in the last 168 hours.  Microbiology Results  Results for orders placed or performed during the hospital encounter of 01/01/19  SARS Coronavirus 2 [facility] order, Performed in [facility] hospital lab) Nasopharyngeal Nasopharyngeal Swab     Status: Abnormal   Collection Time: 01/01/19  6:57 PM   Specimen: Nasopharyngeal Swab  Result Value Ref Range Status   SARS Coronavirus 2 POSITIVE (A) NEGATIVE Final    Comment: RESULT CALLED TO, READ BACK BY AND VERIFIED WITH: GEORGIE HAHLE RN AT 2021 ON 01/01/2019 SNG (NOTE) If result is NEGATIVE SARS-CoV-2 target nucleic acids are NOT DETECTED. The SARS-CoV-2 RNA is generally detectable in upper and lower  respiratory specimens during the acute phase of infection. The lowest  concentration of SARS-CoV-2 viral copies this assay can detect is 250  copies / mL. A negative result does not preclude SARS-CoV-2 infection  and should not be used as the sole  basis for treatment or other  patient management decisions.  A negative result may occur with  improper specimen collection / handling, submission of specimen other  than nasopharyngeal swab, presence of viral mutation(s) within the  areas targeted by this assay, and inadequate number of viral copies  (<250 copies / mL). A negative result must be combined with clinical  observations, patient history, and epidemiological information. If result is POSITIVE SARS-CoV-2 target nucleic acids are DETECT ED. The SARS-CoV-2 RNA is generally detectable in upper and lower  respiratory specimens during the acute phase of infection.  Positive  results are indicative of active infection with SARS-CoV-2.  Clinical  correlation with patient history and other diagnostic information is  necessary to determine patient infection status.  Positive results do  not rule out bacterial infection or co-infection with other viruses. If result is PRESUMPTIVE POSTIVE SARS-CoV-2 nucleic acids MAY BE PRESENT.   A presumptive positive result was obtained on the submitted specimen  and confirmed on repeat testing.  While 2019 novel coronavirus  (  SARS-CoV-2) nucleic acids may be present in the submitted sample  additional confirmatory testing may be necessary for epidemiological  and / or clinical management purposes  to differentiate between  SARS-CoV-2 and other Sarbecovirus currently known to infect humans.  If clinically indicated additional testing with an alternate test  methodology ([id number] [id number]) is advised. The SARS-CoV-2 RNA is generally  detectable in upper and lower respiratory specimens during the acute  phase of infection. The expected result is Negative. Fact Sheet for Patients:  StrictlyIdeas.no Fact Sheet for Healthcare Providers: BankingDealers.co.za This test is not yet approved or cleared by the [place] FDA and has been authorized for detection  and/or diagnosis of SARS-CoV-2 by FDA under an Emergency Use Authorization (EUA).  This EUA will remain in effect (meaning this test can be used) for the duration of the COVID-19 declaration under Section 564(b)(1) of the Act, 21 U.S.C. section 360bbb-3(b)(1), unless the authorization is terminated or revoked sooner. Performed at [facility], [address], [place], [place] [address]     RADIOLOGY:  No results found.  Follow up with PCP in 1 week.  Management plans discussed with the patient, family and they are in agreement.  CODE STATUS:  Code Status History    Date Active Date Inactive Code Status Order ID Comments User Context   01/02/2019 1034 01/05/2019 1659 Full Code [id number]  Demetrios Loll, MD ED   12/18/2018 0058 12/23/2018 1930 Full Code [id number]  Kristopher Oppenheim, DO Inpatient   Advance Care Planning Activity    Advance Directive Documentation     Most Recent Value  Type of Advance Directive  Living will  Pre-existing out of facility DNR order (yellow form or pink MOST form)  -  "MOST" Form in Place?  -      TOTAL TIME TAKING CARE OF THIS PATIENT ON DAY OF DISCHARGE: more than 30 minutes.   Leia Alf Deedra Pro M.D on 01/05/2019 at 8:34 PM  Between 7am to 6pm - Pager - [phone]  After 6pm go to [email or web address] - password [email or web address] [facility]  Office  [phone]  CC: Primary care physician; Tracie Harrier, MD  Note: This dictation was prepared with Dragon dictation along with smaller phrase technology. Any transcriptional errors that result from this process are unintentional.

## 2019-01-12 ENCOUNTER — Ambulatory Visit
Admission: RE | Admit: 2019-01-12 | Discharge: 2019-01-12 | Disposition: A | Discharge status: Home / Self Care | Payer: Medicare Other | Source: Ambulatory Visit | Attending: Interventional Radiology | Admitting: Interventional Radiology

## 2019-01-12 ENCOUNTER — Ambulatory Visit: Admission: RE | Admit: 2019-01-12 | Payer: Medicare Other | Source: Ambulatory Visit

## 2019-01-12 ENCOUNTER — Other Ambulatory Visit: Payer: Self-pay | Admitting: Interventional Radiology

## 2019-01-12 ENCOUNTER — Ambulatory Visit
Admission: RE | Admit: 2019-01-12 | Discharge: 2019-01-12 | Disposition: A | Discharge status: Home / Self Care | Payer: Medicare Other | Source: Ambulatory Visit | Attending: Internal Medicine | Admitting: Internal Medicine

## 2019-01-12 ENCOUNTER — Other Ambulatory Visit: Payer: Self-pay

## 2019-01-12 ENCOUNTER — Other Ambulatory Visit: Admission: RE | Admit: 2019-01-12 | Payer: Medicare Other | Source: Ambulatory Visit

## 2019-01-12 ENCOUNTER — Other Ambulatory Visit: Payer: Self-pay | Admitting: Internal Medicine

## 2019-01-12 DIAGNOSIS — Z9889 Other specified postprocedural states: Secondary | ICD-10-CM

## 2019-01-12 DIAGNOSIS — J90 Pleural effusion, not elsewhere classified: Secondary | ICD-10-CM

## 2019-01-12 LAB — BODY FLUID CELL COUNT WITH DIFFERENTIAL
Eos, Fluid: 0 %
Lymphs, Fluid: 18 %
Monocyte-Macrophage-Serous Fluid: 79 %
Neutrophil Count, Fluid: 3 %
Total Nucleated Cell Count, Fluid: 270898 cu mm

## 2019-01-12 LAB — LACTATE DEHYDROGENASE, PLEURAL OR PERITONEAL FLUID: LD, Fluid: 9346 U/L — ABNORMAL HIGH (ref 3–23)

## 2019-01-12 LAB — PROTEIN, PLEURAL OR PERITONEAL FLUID: Total protein, fluid: 4.6 g/dL

## 2019-01-14 LAB — BODY FLUID CULTURE

## 2019-01-15 LAB — PATHOLOGIST SMEAR REVIEW

## 2019-06-14 ENCOUNTER — Encounter: Payer: Medicare Other | Attending: Pulmonary Disease

## 2019-06-14 ENCOUNTER — Other Ambulatory Visit: Payer: Self-pay

## 2019-06-14 DIAGNOSIS — K219 Gastro-esophageal reflux disease without esophagitis: Secondary | ICD-10-CM | POA: Insufficient documentation

## 2019-06-14 DIAGNOSIS — J439 Emphysema, unspecified: Secondary | ICD-10-CM | POA: Insufficient documentation

## 2019-06-14 DIAGNOSIS — I110 Hypertensive heart disease with heart failure: Secondary | ICD-10-CM | POA: Insufficient documentation

## 2019-06-14 DIAGNOSIS — Z7901 Long term (current) use of anticoagulants: Secondary | ICD-10-CM | POA: Insufficient documentation

## 2019-06-14 DIAGNOSIS — Z7982 Long term (current) use of aspirin: Secondary | ICD-10-CM | POA: Insufficient documentation

## 2019-06-14 DIAGNOSIS — Z87891 Personal history of nicotine dependence: Secondary | ICD-10-CM | POA: Insufficient documentation

## 2019-06-14 DIAGNOSIS — J869 Pyothorax without fistula: Secondary | ICD-10-CM

## 2019-06-14 DIAGNOSIS — Z79899 Other long term (current) drug therapy: Secondary | ICD-10-CM | POA: Insufficient documentation

## 2019-06-14 NOTE — Progress Notes (Signed)
Virtual Orientation performed. Patient informed when to come in for RD and EP orientation. Diagnosis can be found in [facility] 06/01/2019.

## 2019-06-19 ENCOUNTER — Encounter: Payer: Medicare Other | Admitting: *Deleted

## 2019-06-19 ENCOUNTER — Other Ambulatory Visit: Payer: Self-pay

## 2019-06-19 VITALS — Ht 75.6 in | Wt 223.0 lb

## 2019-06-19 DIAGNOSIS — I110 Hypertensive heart disease with heart failure: Secondary | ICD-10-CM | POA: Diagnosis not present

## 2019-06-19 DIAGNOSIS — J439 Emphysema, unspecified: Secondary | ICD-10-CM | POA: Diagnosis present

## 2019-06-19 DIAGNOSIS — K219 Gastro-esophageal reflux disease without esophagitis: Secondary | ICD-10-CM | POA: Diagnosis not present

## 2019-06-19 DIAGNOSIS — Z87891 Personal history of nicotine dependence: Secondary | ICD-10-CM | POA: Diagnosis not present

## 2019-06-19 DIAGNOSIS — J869 Pyothorax without fistula: Secondary | ICD-10-CM

## 2019-06-19 DIAGNOSIS — Z7901 Long term (current) use of anticoagulants: Secondary | ICD-10-CM | POA: Diagnosis not present

## 2019-06-19 DIAGNOSIS — Z79899 Other long term (current) drug therapy: Secondary | ICD-10-CM | POA: Diagnosis not present

## 2019-06-19 DIAGNOSIS — Z7982 Long term (current) use of aspirin: Secondary | ICD-10-CM | POA: Diagnosis not present

## 2019-06-19 NOTE — Progress Notes (Signed)
Pulmonary Individual Treatment Plan  Patient Details  Name: Ricky Petersen MRN: [id number] Date of Birth: 11-11-1945 Referring Provider:     Pulmonary Rehab from 06/19/2019 in [facility] Cardiac and Pulmonary Rehab  Referring Provider  Susa Simmonds MD      Initial Encounter Date:    Pulmonary Rehab from 06/19/2019 in [facility] Cardiac and Pulmonary Rehab  Date  06/19/19      Visit Diagnosis: Empyema ([place])  Patient's Home Medications on Admission:  Current Outpatient Medications:  .  aspirin EC 81 MG tablet, Take 81 mg by mouth daily., Disp: , Rfl:  .  Eliquis DVT/PE Starter Pack (ELIQUIS STARTER PACK) 5 MG TABS, Take as directed on package: start with two-86m tablets twice daily for 7 days. On day 8, switch to one-526mtablet twice daily., Disp: 1 each, Rfl: 0 .  fluticasone (FLONASE) 50 MCG/ACT nasal spray, Place 2 sprays into both nostrils daily., Disp: , Rfl:  .  HYDROcodone-acetaminophen (NORCO/VICODIN) 5-325 MG tablet, Take 1 tablet by mouth 3 (three) times daily as needed for severe pain. (Patient not taking: Reported on 06/14/2019), Disp: 15 tablet, Rfl: 0 .  losartan (COZAAR) 100 MG tablet, Take 150 mg by mouth daily. , Disp: , Rfl:  .  metoprolol succinate (TOPROL-XL) 50 MG 24 hr tablet, Take 50 mg by mouth daily., Disp: , Rfl:  .  omeprazole (PRILOSEC) 20 MG capsule, Take 20 mg by mouth 2 (two) times daily before a meal., Disp: , Rfl:   Past Medical History: Past Medical History:  Diagnosis Date  . COVID-19   . GERD (gastroesophageal reflux disease)   . Hypertension   . [place]opathic [place]  . Sleep apnea     Tobacco Use: Social History   Tobacco Use  Smoking Status Former Smoker  . Packs/day: 1.00  . Years: 15.00  . Pack years: 15.00  . Types: Cigarettes  . Quit date: 1948. Years since quitting: 41.2  Smokeless Tobacco Never Used    Labs: Recent Review Flowsheet Data    Labs for ITP Cardiac and Pulmonary Rehab 10/26/2006   HCO3 26.4(H)   TCO2 28        Pulmonary Assessment Scores: Pulmonary Assessment Scores    Row Name 06/19/19 1113         ADL UCSD   ADL Phase  Entry     SOB Score total  26     Rest  0     Walk  2     Stairs  3     Bath  0     Dress  0     Shop  2       CAT Score   CAT Score  14       mMRC Score   mMRC Score  2        UCSD: Self-administered rating of dyspnea associated with activities of daily living (ADLs) 6-point scale (0 = "not at all" to 5 = "maximal or unable to do because of breathlessness")  Scoring Scores range from 0 to 120.  Minimally important difference is 5 units  CAT: CAT can identify the health impairment of COPD patients and is better correlated with disease progression.  CAT has a scoring range of zero to 40. The CAT score is classified into four groups of low (less than 10), medium (10 - 20), high (21-30) and very high (31-40) based on the impact level of disease on health status. A CAT score over 10  suggests significant symptoms.  A worsening CAT score could be explained by an exacerbation, poor medication adherence, poor inhaler technique, or progression of COPD or comorbid conditions.  CAT MCID is 2 points  mMRC: mMRC (Modified Medical Research Council) Dyspnea Scale is used to assess the degree of baseline functional disability in patients of respiratory disease due to dyspnea. No minimal important difference is established. A decrease in score of 1 point or greater is considered a positive change.   Pulmonary Function Assessment: Pulmonary Function Assessment - 06/19/19 1113      Breath   Shortness of Breath  Yes;Fear of Shortness of Breath;Limiting activity       Exercise Target Goals: Exercise Program Goal: Individual exercise prescription set using results from initial 6 min walk test and THRR while considering  patient's activity barriers and safety.   Exercise Prescription Goal: Initial exercise prescription builds to 30-45 minutes a day of aerobic activity,  2-3 days per week.  Home exercise guidelines will be given to patient during program as part of exercise prescription that the participant will acknowledge.  Activity Barriers & Risk Stratification: Activity Barriers & Cardiac Risk Stratification - 06/19/19 1108      Activity Barriers & Cardiac Risk Stratification   Activity Barriers  Balance Concerns;Deconditioning;Muscular Weakness;Other (comment);Joint Problems    Comments  numbness in both feet, L shoulder limited ROM from lung surgery       6 Minute Walk: 6 Minute Walk    Row Name 06/19/19 1100         6 Minute Walk   Phase  Initial     Distance  1435 feet     Walk Time  6 minutes     # of Rest Breaks  0     MPH  2.72     METS  3.1     RPE  11     Perceived Dyspnea   2     VO2 Peak  10.84     Symptoms  Yes (comment)     Comments  SOB, fatigue at end     Resting HR  63 bpm     Resting BP  108/64     Resting Oxygen Saturation   92 %     Exercise Oxygen Saturation  during 6 min walk  90 %     Max Ex. HR  103 bpm     Max Ex. BP  134/54     2 Minute Post BP  126/54       Interval HR   1 Minute HR  93     2 Minute HR  102     3 Minute HR  103     4 Minute HR  102     5 Minute HR  101     6 Minute HR  99     2 Minute Post HR  65     Interval Heart Rate?  Yes       Interval Oxygen   Interval Oxygen?  Yes     Baseline Oxygen Saturation %  92 %     1 Minute Oxygen Saturation %  95 %     1 Minute Liters of Oxygen  0 L Room Air     2 Minute Oxygen Saturation %  94 %     2 Minute Liters of Oxygen  0 L     3 Minute Oxygen Saturation %  92 %     3 Minute  Liters of Oxygen  0 L     4 Minute Oxygen Saturation %  91 %     4 Minute Liters of Oxygen  0 L     5 Minute Oxygen Saturation %  90 %     5 Minute Liters of Oxygen  0 L     6 Minute Oxygen Saturation %  90 %     6 Minute Liters of Oxygen  0 L     2 Minute Post Oxygen Saturation %  97 %     2 Minute Post Liters of Oxygen  0 L       Oxygen Initial  Assessment: Oxygen Initial Assessment - 06/14/19 1037      Home Oxygen   Home Oxygen Device  None    Sleep Oxygen Prescription  CPAP    Liters per minute  0    Home Exercise Oxygen Prescription  None    Home at Rest Exercise Oxygen Prescription  None    Compliance with Home Oxygen Use  Yes      Initial 6 min Walk   Oxygen Used  None      Program Oxygen Prescription   Program Oxygen Prescription  None      Intervention   Short Term Goals  To learn and exhibit compliance with exercise, home and travel O2 prescription;To learn and understand importance of monitoring SPO2 with pulse oximeter and demonstrate accurate use of the pulse oximeter.;To learn and understand importance of maintaining oxygen saturations>88%;To learn and demonstrate proper pursed lip breathing techniques or other breathing techniques.;To learn and demonstrate proper use of respiratory medications    Long  Term Goals  Exhibits compliance with exercise, home and travel O2 prescription;Verbalizes importance of monitoring SPO2 with pulse oximeter and return demonstration;Maintenance of O2 saturations>88%;Exhibits proper breathing techniques, such as pursed lip breathing or other method taught during program session;Compliance with respiratory medication;Demonstrates proper use of MDI's       Oxygen Re-Evaluation:   Oxygen Discharge (Final Oxygen Re-Evaluation):   Initial Exercise Prescription: Initial Exercise Prescription - 06/19/19 1100      Date of Initial Exercise RX and Referring Provider   Date  06/19/19    Referring Provider  Susa Simmonds MD      Treadmill   MPH  2.7    Grade  0.5    Minutes  15    METs  3.25      Elliptical   Level  1    Speed  2.7    Minutes  15    METs  3      REL-XR   Level  3    Speed  50    Minutes  15    METs  3      Prescription Details   Frequency (times per week)  2    Duration  Progress to 30 minutes of continuous aerobic without signs/symptoms of physical  distress      Intensity   THRR 40-80% of Max Heartrate  96-129    Ratings of Perceived Exertion  11-13    Perceived Dyspnea  0-4      Progression   Progression  Continue to progress workloads to maintain intensity without signs/symptoms of physical distress.      Resistance Training   Training Prescription  Yes    Weight  4 lb    Reps  10-15       Perform Capillary Blood Glucose checks as needed.  Exercise Prescription  Changes: Exercise Prescription Changes    Row Name 06/19/19 1100             Response to Exercise   Blood Pressure (Admit)  108/64       Blood Pressure (Exercise)  134/54       Blood Pressure (Exit)  126/60       Heart Rate (Admit)  63 bpm       Heart Rate (Exercise)  103 bpm       Heart Rate (Exit)  64 bpm       Oxygen Saturation (Admit)  92 %       Oxygen Saturation (Exercise)  90 %       Oxygen Saturation (Exit)  96 %       Rating of Perceived Exertion (Exercise)  11       Perceived Dyspnea (Exercise)  2       Symptoms  SOB, fatigue at end       Comments  walk test results          Exercise Comments:   Exercise Goals and Review: Exercise Goals    Row Name 06/19/19 1110             Exercise Goals   Increase Physical Activity  Yes       Intervention  Provide advice, education, support and counseling about physical activity/exercise needs.;Develop an individualized exercise prescription for aerobic and resistive training based on initial evaluation findings, risk stratification, comorbidities and participant's personal goals.       Expected Outcomes  Short Term: Attend rehab on a regular basis to increase amount of physical activity.;Long Term: Add in home exercise to make exercise part of routine and to increase amount of physical activity.;Long Term: Exercising regularly at least 3-5 days a week.       Increase Strength and Stamina  Yes       Intervention  Provide advice, education, support and counseling about physical activity/exercise  needs.;Develop an individualized exercise prescription for aerobic and resistive training based on initial evaluation findings, risk stratification, comorbidities and participant's personal goals.       Expected Outcomes  Short Term: Increase workloads from initial exercise prescription for resistance, speed, and METs.;Short Term: Perform resistance training exercises routinely during rehab and add in resistance training at home;Long Term: Improve cardiorespiratory fitness, muscular endurance and strength as measured by increased METs and functional capacity (6MWT)       Able to understand and use rate of perceived exertion (RPE) scale  Yes       Intervention  Provide education and explanation on how to use RPE scale       Expected Outcomes  Long Term:  Able to use RPE to guide intensity level when exercising independently;Short Term: Able to use RPE daily in rehab to express subjective intensity level       Able to understand and use Dyspnea scale  Yes       Intervention  Provide education and explanation on how to use Dyspnea scale       Expected Outcomes  Short Term: Able to use Dyspnea scale daily in rehab to express subjective sense of shortness of breath during exertion;Long Term: Able to use Dyspnea scale to guide intensity level when exercising independently       Knowledge and understanding of Target Heart Rate Range (THRR)  Yes       Intervention  Provide education and explanation of THRR including how the numbers were  predicted and where they are located for reference       Expected Outcomes  Short Term: Able to use daily as guideline for intensity in rehab;Long Term: Able to use THRR to govern intensity when exercising independently;Short Term: Able to state/look up THRR       Able to check pulse independently  Yes       Intervention  Review the importance of being able to check your own pulse for safety during independent exercise;Provide education and demonstration on how to check pulse in  carotid and radial arteries.       Expected Outcomes  Short Term: Able to explain why pulse checking is important during independent exercise;Long Term: Able to check pulse independently and accurately       Understanding of Exercise Prescription  Yes       Intervention  Provide education, explanation, and written materials on patient's individual exercise prescription       Expected Outcomes  Short Term: Able to explain program exercise prescription;Long Term: Able to explain home exercise prescription to exercise independently          Exercise Goals Re-Evaluation :   Discharge Exercise Prescription (Final Exercise Prescription Changes): Exercise Prescription Changes - 06/19/19 1100      Response to Exercise   Blood Pressure (Admit)  108/64    Blood Pressure (Exercise)  134/54    Blood Pressure (Exit)  126/60    Heart Rate (Admit)  63 bpm    Heart Rate (Exercise)  103 bpm    Heart Rate (Exit)  64 bpm    Oxygen Saturation (Admit)  92 %    Oxygen Saturation (Exercise)  90 %    Oxygen Saturation (Exit)  96 %    Rating of Perceived Exertion (Exercise)  11    Perceived Dyspnea (Exercise)  2    Symptoms  SOB, fatigue at end    Comments  walk test results       Nutrition:  Target Goals: Understanding of nutrition guidelines, daily intake of sodium <1561m, cholesterol <2031m calories 30% from fat and 7% or less from saturated fats, daily to have 5 or more servings of fruits and vegetables.  Biometrics: Pre Biometrics - 06/19/19 1112      Pre Biometrics   Height  6' 3.6" (1.92 m)    Weight  223 lb (101.2 kg)    BMI (Calculated)  27.44    Single Leg Stand  2.34 seconds        Nutrition Therapy Plan and Nutrition Goals:   Nutrition Assessments: Nutrition Assessments - 06/19/19 1111      MEDFICTS Scores   Pre Score  39       Nutrition Goals Re-Evaluation:   Nutrition Goals Discharge (Final Nutrition Goals Re-Evaluation):   Psychosocial: Target Goals:  Acknowledge presence or absence of significant depression and/or stress, maximize coping skills, provide positive support system. Participant is able to verbalize types and ability to use techniques and skills needed for reducing stress and depression.   Initial Review & Psychosocial Screening: Initial Psych Review & Screening - 06/14/19 1038      Initial Review   Current issues with  Current Sleep Concerns;Current Stress Concerns    Source of Stress Concerns  Chronic Illness    Comments  Sometimes he cannot sleep well. He wakes up with aches and pains at times.      Family Dynamics   Good Support System?  Yes    Comments  He can  look to his wife, daughters and 4 grandchildren for support.      Barriers   Psychosocial barriers to participate in program  The patient should benefit from training in stress management and relaxation.      Screening Interventions   Interventions  Encouraged to exercise;To provide support and resources with identified psychosocial needs;Provide feedback about the scores to participant    Expected Outcomes  Short Term goal: Utilizing psychosocial counselor, staff and physician to assist with identification of specific Stressors or current issues interfering with healing process. Setting desired goal for each stressor or current issue identified.;Long Term Goal: Stressors or current issues are controlled or eliminated.;Short Term goal: Identification and review with participant of any Quality of Life or Depression concerns found by scoring the questionnaire.;Long Term goal: The participant improves quality of Life and PHQ9 Scores as seen by post scores and/or verbalization of changes       Quality of Life Scores:  Scores of 19 and below usually indicate a poorer quality of life in these areas.  A difference of  2-3 points is a clinically meaningful difference.  A difference of 2-3 points in the total score of the Quality of Life Index has been associated with  significant improvement in overall quality of life, self-image, physical symptoms, and general health in studies assessing change in quality of life.  PHQ-9: Recent Review Flowsheet Data    Depression screen [facility] 2/9 06/19/2019   Decreased Interest 0   Down, Depressed, Hopeless 0   PHQ - 2 Score 0   Altered sleeping 1   Tired, decreased energy 0   Change in appetite 1   Feeling bad or failure about yourself  0   Trouble concentrating 0   Moving slowly or fidgety/restless 0   Suicidal thoughts 0   PHQ-9 Score 2   Difficult doing work/chores Not difficult at all     Interpretation of Total Score  Total Score Depression Severity:  1-4 = Minimal depression, 5-9 = Mild depression, 10-14 = Moderate depression, 15-19 = Moderately severe depression, 20-27 = Severe depression   Psychosocial Evaluation and Intervention: Psychosocial Evaluation - 06/14/19 1042      Psychosocial Evaluation & Interventions   Interventions  Encouraged to exercise with the program and follow exercise prescription    Comments  patient has a positive outlook on his health and is read to start pulmonary rehab.    Expected Outcomes  Short: Attend [facility] stress management education to decrease stress. Long: Maintain exercise Post [facility] to keep stress at a minimum.    Continue Psychosocial Services   Follow up required by staff       Psychosocial Re-Evaluation:   Psychosocial Discharge (Final Psychosocial Re-Evaluation):   Education: Education Goals: Education classes will be provided on a weekly basis, covering required topics. Participant will state understanding/return demonstration of topics presented.  Learning Barriers/Preferences: Learning Barriers/Preferences - 06/14/19 1039      Learning Barriers/Preferences   Learning Barriers  None    Learning Preferences  None       Education Topics:  Initial Evaluation Education: - Verbal, written and demonstration of respiratory meds, oximetry and  breathing techniques. Instruction on use of nebulizers and MDIs and importance of monitoring MDI activations.   General Nutrition Guidelines/Fats and Fiber: -Group instruction provided by verbal, written material, models and posters to present the general guidelines for heart healthy nutrition. Gives an explanation and review of dietary fats and fiber.   Controlling Sodium/Reading Food Labels: -Group  verbal and written material supporting the discussion of sodium use in heart healthy nutrition. Review and explanation with models, verbal and written materials for utilization of the food label.   Exercise Physiology & General Exercise Guidelines: - Group verbal and written instruction with models to review the exercise physiology of the cardiovascular system and associated critical values. Provides general exercise guidelines with specific guidelines to those with heart or lung disease.    Aerobic Exercise & Resistance Training: - Gives group verbal and written instruction on the various components of exercise. Focuses on aerobic and resistive training programs and the benefits of this training and how to safely progress through these programs.   Flexibility, Balance, Mind/Body Relaxation: Provides group verbal/written instruction on the benefits of flexibility and balance training, including mind/body exercise modes such as yoga, pilates and tai chi.  Demonstration and skill practice provided.   Stress and Anxiety: - Provides group verbal and written instruction about the health risks of elevated stress and causes of high stress.  Discuss the correlation between heart/lung disease and anxiety and treatment options. Review healthy ways to manage with stress and anxiety.   Depression: - Provides group verbal and written instruction on the correlation between heart/lung disease and depressed mood, treatment options, and the stigmas associated with seeking treatment.   Exercise & Equipment  Safety: - Individual verbal instruction and demonstration of equipment use and safety with use of the equipment.   Pulmonary Rehab from 06/14/2019 in [facility] Cardiac and Pulmonary Rehab  Date  06/14/19  Educator  [facility]  Instruction Review Code  1- Verbalizes Understanding      Infection Prevention: - Provides verbal and written material to individual with discussion of infection control including proper hand washing and proper equipment cleaning during exercise session.   Pulmonary Rehab from 06/14/2019 in [facility] Cardiac and Pulmonary Rehab  Date  06/14/19  Educator  [facility]  Instruction Review Code  1- Verbalizes Understanding      Falls Prevention: - Provides verbal and written material to individual with discussion of falls prevention and safety.   Pulmonary Rehab from 06/14/2019 in [facility] Cardiac and Pulmonary Rehab  Date  06/14/19  Educator  [facility]  Instruction Review Code  1- Verbalizes Understanding      Diabetes: - Individual verbal and written instruction to review signs/symptoms of diabetes, desired ranges of glucose level fasting, after meals and with exercise. Advice that pre and post exercise glucose checks will be done for 3 sessions at entry of program.   Chronic Lung Diseases: - Group verbal and written instruction to review updates, respiratory medications, advancements in procedures and treatments. Discuss use of supplemental oxygen including available portable oxygen systems, continuous and intermittent flow rates, concentrators, personal use and safety guidelines. Review proper use of inhaler and spacers. Provide informative websites for self-education.    Energy Conservation: - Provide group verbal and written instruction for methods to conserve energy, plan and organize activities. Instruct on pacing techniques, use of adaptive equipment and posture/positioning to relieve shortness of breath.   Triggers and Exacerbations: - Group verbal and written instruction to review types  of environmental triggers and ways to prevent exacerbations. Discuss weather changes, air quality and the benefits of nasal washing. Review warning signs and symptoms to help prevent infections. Discuss techniques for effective airway clearance, coughing, and vibrations.   AED/CPR: - Group verbal and written instruction with the use of models to demonstrate the basic use of the AED with the basic ABC's of resuscitation.  Anatomy and Physiology of the Lungs: - Group verbal and written instruction with the use of models to provide basic lung anatomy and physiology related to function, structure and complications of lung disease.   Anatomy & Physiology of the Heart: - Group verbal and written instruction and models provide basic cardiac anatomy and physiology, with the coronary electrical and arterial systems. Review of Valvular disease and Heart Failure   Cardiac Medications: - Group verbal and written instruction to review commonly prescribed medications for heart disease. Reviews the medication, class of the drug, and side effects.   Know Your Numbers and Risk Factors: -Group verbal and written instruction about important numbers in your health.  Discussion of what are risk factors and how they play a role in the disease process.  Review of Cholesterol, Blood Pressure, Diabetes, and BMI and the role they play in your overall health.   Sleep Hygiene: -Provides group verbal and written instruction about how sleep can affect your health.  Define sleep hygiene, discuss sleep cycles and impact of sleep habits. Review good sleep hygiene tips.    Other: -Provides group and verbal instruction on various topics (see comments)    Knowledge Questionnaire Score: Knowledge Questionnaire Score - 06/19/19 1111      Knowledge Questionnaire Score   Pre Score  17/18 Education Focus: O2 safety        Core Components/Risk Factors/Patient Goals at Admission: Personal Goals and Risk Factors at  Admission - 06/19/19 1111      Core Components/Risk Factors/Patient Goals on Admission    Weight Management  Yes;Weight Loss    Intervention  Weight Management: Develop a combined nutrition and exercise program designed to reach desired caloric intake, while maintaining appropriate intake of nutrient and fiber, sodium and fats, and appropriate energy expenditure required for the weight goal.;Weight Management: Provide education and appropriate resources to help participant work on and attain dietary goals.    Admit Weight  223 lb (101.2 kg)    Goal Weight: Short Term  218 lb (98.9 kg)    Goal Weight: Long Term  208 lb (94.3 kg)    Expected Outcomes  Short Term: Continue to assess and modify interventions until short term weight is achieved;Long Term: Adherence to nutrition and physical activity/exercise program aimed toward attainment of established weight goal;Weight Loss: Understanding of general recommendations for a balanced deficit meal plan, which promotes 1-2 lb weight loss per week and includes a negative energy balance of [phone] kcal/d;Understanding recommendations for meals to include 15-35% energy as protein, 25-35% energy from fat, 35-60% energy from carbohydrates, less than 2103m of dietary cholesterol, 20-35 gm of total fiber daily;Understanding of distribution of calorie intake throughout the day with the consumption of 4-5 meals/snacks    Improve shortness of breath with ADL's  Yes    Intervention  Provide education, individualized exercise plan and daily activity instruction to help decrease symptoms of SOB with activities of daily living.    Expected Outcomes  Short Term: Improve cardiorespiratory fitness to achieve a reduction of symptoms when performing ADLs;Long Term: Be able to perform more ADLs without symptoms or delay the onset of symptoms    Heart Failure  Yes    Intervention  Provide a combined exercise and nutrition program that is supplemented with education, support and  counseling about heart failure. Directed toward relieving symptoms such as shortness of breath, decreased exercise tolerance, and extremity edema.    Expected Outcomes  Improve functional capacity of life;Short term: Attendance in program 2-3  days a week with increased exercise capacity. Reported lower sodium intake. Reported increased fruit and vegetable intake. Reports medication compliance.;Short term: Daily weights obtained and reported for increase. Utilizing diuretic protocols set by physician.;Long term: Adoption of self-care skills and reduction of barriers for early signs and symptoms recognition and intervention leading to self-care maintenance.    Hypertension  Yes    Intervention  Provide education on lifestyle modifcations including regular physical activity/exercise, weight management, moderate sodium restriction and increased consumption of fresh fruit, vegetables, and low fat dairy, alcohol moderation, and smoking cessation.;Monitor prescription use compliance.    Expected Outcomes  Short Term: Continued assessment and intervention until BP is < 140/66m HG in hypertensive participants. < 130/824mHG in hypertensive participants with diabetes, heart failure or chronic kidney disease.;Long Term: Maintenance of blood pressure at goal levels.       Core Components/Risk Factors/Patient Goals Review:    Core Components/Risk Factors/Patient Goals at Discharge (Final Review):    ITP Comments: ITP Comments    Row Name 06/14/19 1045 06/19/19 1100         ITP Comments  Virtual Orientation performed. Patient informed when to come in for RD and EP orientation. [facility]  Completed 6MWT and gym orientation.  Initial ITP created and sent for review to Dr. MaEmily FilbertMedical Director.         Comments: Initial ITP

## 2019-06-19 NOTE — Patient Instructions (Signed)
Patient Instructions  Patient Details  Name: Ricky Petersen MRN: [id number] Date of Birth: Sep 09, 1945 Referring Provider:  Ottie Glazier, MD  Below are your personal goals for exercise, nutrition, and risk factors. Our goal is to help you stay on track towards obtaining and maintaining these goals. We will be discussing your progress on these goals with you throughout the program.  Initial Exercise Prescription: Initial Exercise Prescription - 06/19/19 1100      Date of Initial Exercise RX and Referring Provider   Date  06/19/19    Referring Provider  Susa Simmonds MD      Treadmill   MPH  2.7    Grade  0.5    Minutes  15    METs  3.25      Elliptical   Level  1    Speed  2.7    Minutes  15    METs  3      REL-XR   Level  3    Speed  50    Minutes  15    METs  3      Prescription Details   Frequency (times per week)  2    Duration  Progress to 30 minutes of continuous aerobic without signs/symptoms of physical distress      Intensity   THRR 40-80% of Max Heartrate  96-129    Ratings of Perceived Exertion  11-13    Perceived Dyspnea  0-4      Progression   Progression  Continue to progress workloads to maintain intensity without signs/symptoms of physical distress.      Resistance Training   Training Prescription  Yes    Weight  4 lb    Reps  10-15       Exercise Goals: Frequency: Be able to perform aerobic exercise two to three times per week in program working toward 2-5 days per week of home exercise.  Intensity: Work with a perceived exertion of 11 (fairly light) - 15 (hard) while following your exercise prescription.  We will make changes to your prescription with you as you progress through the program.   Duration: Be able to do 30 to 45 minutes of continuous aerobic exercise in addition to a 5 minute warm-up and a 5 minute cool-down routine.   Nutrition Goals: Your personal nutrition goals will be established when you do your nutrition analysis  with the dietician.  The following are general nutrition guidelines to follow: Cholesterol < 200mg /day Sodium < 1500mg /day Fiber: Men over 50 yrs - 30 grams per day  Personal Goals: Personal Goals and Risk Factors at Admission - 06/19/19 1111      Core Components/Risk Factors/Patient Goals on Admission    Weight Management  Yes;Weight Loss    Intervention  Weight Management: Develop a combined nutrition and exercise program designed to reach desired caloric intake, while maintaining appropriate intake of nutrient and fiber, sodium and fats, and appropriate energy expenditure required for the weight goal.;Weight Management: Provide education and appropriate resources to help participant work on and attain dietary goals.    Admit Weight  223 lb (101.2 kg)    Goal Weight: Short Term  218 lb (98.9 kg)    Goal Weight: Long Term  208 lb (94.3 kg)    Expected Outcomes  Short Term: Continue to assess and modify interventions until short term weight is achieved;Long Term: Adherence to nutrition and physical activity/exercise program aimed toward attainment of established weight goal;Weight Loss: Understanding of general recommendations  for a balanced deficit meal plan, which promotes 1-2 lb weight loss per week and includes a negative energy balance of [phone] kcal/d;Understanding recommendations for meals to include 15-35% energy as protein, 25-35% energy from fat, 35-60% energy from carbohydrates, less than 200mg  of dietary cholesterol, 20-35 gm of total fiber daily;Understanding of distribution of calorie intake throughout the day with the consumption of 4-5 meals/snacks    Improve shortness of breath with ADL's  Yes    Intervention  Provide education, individualized exercise plan and daily activity instruction to help decrease symptoms of SOB with activities of daily living.    Expected Outcomes  Short Term: Improve cardiorespiratory fitness to achieve a reduction of symptoms when performing ADLs;Long  Term: Be able to perform more ADLs without symptoms or delay the onset of symptoms    Heart Failure  Yes    Intervention  Provide a combined exercise and nutrition program that is supplemented with education, support and counseling about heart failure. Directed toward relieving symptoms such as shortness of breath, decreased exercise tolerance, and extremity edema.    Expected Outcomes  Improve functional capacity of life;Short term: Attendance in program 2-3 days a week with increased exercise capacity. Reported lower sodium intake. Reported increased fruit and vegetable intake. Reports medication compliance.;Short term: Daily weights obtained and reported for increase. Utilizing diuretic protocols set by physician.;Long term: Adoption of self-care skills and reduction of barriers for early signs and symptoms recognition and intervention leading to self-care maintenance.    Hypertension  Yes    Intervention  Provide education on lifestyle modifcations including regular physical activity/exercise, weight management, moderate sodium restriction and increased consumption of fresh fruit, vegetables, and low fat dairy, alcohol moderation, and smoking cessation.;Monitor prescription use compliance.    Expected Outcomes  Short Term: Continued assessment and intervention until BP is < 140/42mm HG in hypertensive participants. < 130/17mm HG in hypertensive participants with diabetes, heart failure or chronic kidney disease.;Long Term: Maintenance of blood pressure at goal levels.       Tobacco Use Initial Evaluation: Social History   Tobacco Use  Smoking Status Former Smoker  . Packs/day: 1.00  . Years: 15.00  . Pack years: 15.00  . Types: Cigarettes  . Quit date: 43  . Years since quitting: 41.2  Smokeless Tobacco Never Used    Exercise Goals and Review: Exercise Goals    Row Name 06/19/19 1110             Exercise Goals   Increase Physical Activity  Yes       Intervention  Provide  advice, education, support and counseling about physical activity/exercise needs.;Develop an individualized exercise prescription for aerobic and resistive training based on initial evaluation findings, risk stratification, comorbidities and participant's personal goals.       Expected Outcomes  Short Term: Attend rehab on a regular basis to increase amount of physical activity.;Long Term: Add in home exercise to make exercise part of routine and to increase amount of physical activity.;Long Term: Exercising regularly at least 3-5 days a week.       Increase Strength and Stamina  Yes       Intervention  Provide advice, education, support and counseling about physical activity/exercise needs.;Develop an individualized exercise prescription for aerobic and resistive training based on initial evaluation findings, risk stratification, comorbidities and participant's personal goals.       Expected Outcomes  Short Term: Increase workloads from initial exercise prescription for resistance, speed, and METs.;Short Term: Perform resistance training exercises  routinely during rehab and add in resistance training at home;Long Term: Improve cardiorespiratory fitness, muscular endurance and strength as measured by increased METs and functional capacity (6MWT)       Able to understand and use rate of perceived exertion (RPE) scale  Yes       Intervention  Provide education and explanation on how to use RPE scale       Expected Outcomes  Long Term:  Able to use RPE to guide intensity level when exercising independently;Short Term: Able to use RPE daily in rehab to express subjective intensity level       Able to understand and use Dyspnea scale  Yes       Intervention  Provide education and explanation on how to use Dyspnea scale       Expected Outcomes  Short Term: Able to use Dyspnea scale daily in rehab to express subjective sense of shortness of breath during exertion;Long Term: Able to use Dyspnea scale to guide  intensity level when exercising independently       Knowledge and understanding of Target Heart Rate Range (THRR)  Yes       Intervention  Provide education and explanation of THRR including how the numbers were predicted and where they are located for reference       Expected Outcomes  Short Term: Able to use daily as guideline for intensity in rehab;Long Term: Able to use THRR to govern intensity when exercising independently;Short Term: Able to state/look up THRR       Able to check pulse independently  Yes       Intervention  Review the importance of being able to check your own pulse for safety during independent exercise;Provide education and demonstration on how to check pulse in carotid and radial arteries.       Expected Outcomes  Short Term: Able to explain why pulse checking is important during independent exercise;Long Term: Able to check pulse independently and accurately       Understanding of Exercise Prescription  Yes       Intervention  Provide education, explanation, and written materials on patient's individual exercise prescription       Expected Outcomes  Short Term: Able to explain program exercise prescription;Long Term: Able to explain home exercise prescription to exercise independently          Copy of goals given to participant.

## 2019-06-20 ENCOUNTER — Encounter: Payer: Self-pay | Admitting: *Deleted

## 2019-06-20 DIAGNOSIS — J869 Pyothorax without fistula: Secondary | ICD-10-CM

## 2019-06-20 DIAGNOSIS — J439 Emphysema, unspecified: Secondary | ICD-10-CM | POA: Diagnosis not present

## 2019-06-20 NOTE — Progress Notes (Signed)
Pulmonary Individual Treatment Plan  Patient Details  Name: Ricky Petersen MRN: [id number] Date of Birth: 11-11-1945 Referring Provider:     Pulmonary Rehab from 06/19/2019 in [facility] Cardiac and Pulmonary Rehab  Referring Provider  Susa Simmonds MD      Initial Encounter Date:    Pulmonary Rehab from 06/19/2019 in [facility] Cardiac and Pulmonary Rehab  Date  06/19/19      Visit Diagnosis: Empyema ([place])  Patient's Home Medications on Admission:  Current Outpatient Medications:  .  aspirin EC 81 MG tablet, Take 81 mg by mouth daily., Disp: , Rfl:  .  Eliquis DVT/PE Starter Pack (ELIQUIS STARTER PACK) 5 MG TABS, Take as directed on package: start with two-86m tablets twice daily for 7 days. On day 8, switch to one-526mtablet twice daily., Disp: 1 each, Rfl: 0 .  fluticasone (FLONASE) 50 MCG/ACT nasal spray, Place 2 sprays into both nostrils daily., Disp: , Rfl:  .  HYDROcodone-acetaminophen (NORCO/VICODIN) 5-325 MG tablet, Take 1 tablet by mouth 3 (three) times daily as needed for severe pain. (Patient not taking: Reported on 06/14/2019), Disp: 15 tablet, Rfl: 0 .  losartan (COZAAR) 100 MG tablet, Take 150 mg by mouth daily. , Disp: , Rfl:  .  metoprolol succinate (TOPROL-XL) 50 MG 24 hr tablet, Take 50 mg by mouth daily., Disp: , Rfl:  .  omeprazole (PRILOSEC) 20 MG capsule, Take 20 mg by mouth 2 (two) times daily before a meal., Disp: , Rfl:   Past Medical History: Past Medical History:  Diagnosis Date  . COVID-19   . GERD (gastroesophageal reflux disease)   . Hypertension   . [place]opathic [place]  . Sleep apnea     Tobacco Use: Social History   Tobacco Use  Smoking Status Former Smoker  . Packs/day: 1.00  . Years: 15.00  . Pack years: 15.00  . Types: Cigarettes  . Quit date: 1948. Years since quitting: 41.2  Smokeless Tobacco Never Used    Labs: Recent Review Flowsheet Data    Labs for ITP Cardiac and Pulmonary Rehab 10/26/2006   HCO3 26.4(H)   TCO2 28        Pulmonary Assessment Scores: Pulmonary Assessment Scores    Row Name 06/19/19 1113         ADL UCSD   ADL Phase  Entry     SOB Score total  26     Rest  0     Walk  2     Stairs  3     Bath  0     Dress  0     Shop  2       CAT Score   CAT Score  14       mMRC Score   mMRC Score  2        UCSD: Self-administered rating of dyspnea associated with activities of daily living (ADLs) 6-point scale (0 = "not at all" to 5 = "maximal or unable to do because of breathlessness")  Scoring Scores range from 0 to 120.  Minimally important difference is 5 units  CAT: CAT can identify the health impairment of COPD patients and is better correlated with disease progression.  CAT has a scoring range of zero to 40. The CAT score is classified into four groups of low (less than 10), medium (10 - 20), high (21-30) and very high (31-40) based on the impact level of disease on health status. A CAT score over 10  suggests significant symptoms.  A worsening CAT score could be explained by an exacerbation, poor medication adherence, poor inhaler technique, or progression of COPD or comorbid conditions.  CAT MCID is 2 points  mMRC: mMRC (Modified Medical Research Council) Dyspnea Scale is used to assess the degree of baseline functional disability in patients of respiratory disease due to dyspnea. No minimal important difference is established. A decrease in score of 1 point or greater is considered a positive change.   Pulmonary Function Assessment: Pulmonary Function Assessment - 06/19/19 1113      Breath   Shortness of Breath  Yes;Fear of Shortness of Breath;Limiting activity       Exercise Target Goals: Exercise Program Goal: Individual exercise prescription set using results from initial 6 min walk test and THRR while considering  patient's activity barriers and safety.   Exercise Prescription Goal: Initial exercise prescription builds to 30-45 minutes a day of aerobic activity,  2-3 days per week.  Home exercise guidelines will be given to patient during program as part of exercise prescription that the participant will acknowledge.  Activity Barriers & Risk Stratification: Activity Barriers & Cardiac Risk Stratification - 06/19/19 1108      Activity Barriers & Cardiac Risk Stratification   Activity Barriers  Balance Concerns;Deconditioning;Muscular Weakness;Other (comment);Joint Problems    Comments  numbness in both feet, L shoulder limited ROM from lung surgery       6 Minute Walk: 6 Minute Walk    Row Name 06/19/19 1100         6 Minute Walk   Phase  Initial     Distance  1435 feet     Walk Time  6 minutes     # of Rest Breaks  0     MPH  2.72     METS  3.1     RPE  11     Perceived Dyspnea   2     VO2 Peak  10.84     Symptoms  Yes (comment)     Comments  SOB, fatigue at end     Resting HR  63 bpm     Resting BP  108/64     Resting Oxygen Saturation   92 %     Exercise Oxygen Saturation  during 6 min walk  90 %     Max Ex. HR  103 bpm     Max Ex. BP  134/54     2 Minute Post BP  126/54       Interval HR   1 Minute HR  93     2 Minute HR  102     3 Minute HR  103     4 Minute HR  102     5 Minute HR  101     6 Minute HR  99     2 Minute Post HR  65     Interval Heart Rate?  Yes       Interval Oxygen   Interval Oxygen?  Yes     Baseline Oxygen Saturation %  92 %     1 Minute Oxygen Saturation %  95 %     1 Minute Liters of Oxygen  0 L Room Air     2 Minute Oxygen Saturation %  94 %     2 Minute Liters of Oxygen  0 L     3 Minute Oxygen Saturation %  92 %     3 Minute  Liters of Oxygen  0 L     4 Minute Oxygen Saturation %  91 %     4 Minute Liters of Oxygen  0 L     5 Minute Oxygen Saturation %  90 %     5 Minute Liters of Oxygen  0 L     6 Minute Oxygen Saturation %  90 %     6 Minute Liters of Oxygen  0 L     2 Minute Post Oxygen Saturation %  97 %     2 Minute Post Liters of Oxygen  0 L       Oxygen Initial  Assessment: Oxygen Initial Assessment - 06/14/19 1037      Home Oxygen   Home Oxygen Device  None    Sleep Oxygen Prescription  CPAP    Liters per minute  0    Home Exercise Oxygen Prescription  None    Home at Rest Exercise Oxygen Prescription  None    Compliance with Home Oxygen Use  Yes      Initial 6 min Walk   Oxygen Used  None      Program Oxygen Prescription   Program Oxygen Prescription  None      Intervention   Short Term Goals  To learn and exhibit compliance with exercise, home and travel O2 prescription;To learn and understand importance of monitoring SPO2 with pulse oximeter and demonstrate accurate use of the pulse oximeter.;To learn and understand importance of maintaining oxygen saturations>88%;To learn and demonstrate proper pursed lip breathing techniques or other breathing techniques.;To learn and demonstrate proper use of respiratory medications    Long  Term Goals  Exhibits compliance with exercise, home and travel O2 prescription;Verbalizes importance of monitoring SPO2 with pulse oximeter and return demonstration;Maintenance of O2 saturations>88%;Exhibits proper breathing techniques, such as pursed lip breathing or other method taught during program session;Compliance with respiratory medication;Demonstrates proper use of MDI's       Oxygen Re-Evaluation:   Oxygen Discharge (Final Oxygen Re-Evaluation):   Initial Exercise Prescription: Initial Exercise Prescription - 06/19/19 1100      Date of Initial Exercise RX and Referring Provider   Date  06/19/19    Referring Provider  Susa Simmonds MD      Treadmill   MPH  2.7    Grade  0.5    Minutes  15    METs  3.25      Elliptical   Level  1    Speed  2.7    Minutes  15    METs  3      REL-XR   Level  3    Speed  50    Minutes  15    METs  3      Prescription Details   Frequency (times per week)  2    Duration  Progress to 30 minutes of continuous aerobic without signs/symptoms of physical  distress      Intensity   THRR 40-80% of Max Heartrate  96-129    Ratings of Perceived Exertion  11-13    Perceived Dyspnea  0-4      Progression   Progression  Continue to progress workloads to maintain intensity without signs/symptoms of physical distress.      Resistance Training   Training Prescription  Yes    Weight  4 lb    Reps  10-15       Perform Capillary Blood Glucose checks as needed.  Exercise Prescription  Changes: Exercise Prescription Changes    Row Name 06/19/19 1100             Response to Exercise   Blood Pressure (Admit)  108/64       Blood Pressure (Exercise)  134/54       Blood Pressure (Exit)  126/60       Heart Rate (Admit)  63 bpm       Heart Rate (Exercise)  103 bpm       Heart Rate (Exit)  64 bpm       Oxygen Saturation (Admit)  92 %       Oxygen Saturation (Exercise)  90 %       Oxygen Saturation (Exit)  96 %       Rating of Perceived Exertion (Exercise)  11       Perceived Dyspnea (Exercise)  2       Symptoms  SOB, fatigue at end       Comments  walk test results          Exercise Comments:   Exercise Goals and Review: Exercise Goals    Row Name 06/19/19 1110             Exercise Goals   Increase Physical Activity  Yes       Intervention  Provide advice, education, support and counseling about physical activity/exercise needs.;Develop an individualized exercise prescription for aerobic and resistive training based on initial evaluation findings, risk stratification, comorbidities and participant's personal goals.       Expected Outcomes  Short Term: Attend rehab on a regular basis to increase amount of physical activity.;Long Term: Add in home exercise to make exercise part of routine and to increase amount of physical activity.;Long Term: Exercising regularly at least 3-5 days a week.       Increase Strength and Stamina  Yes       Intervention  Provide advice, education, support and counseling about physical activity/exercise  needs.;Develop an individualized exercise prescription for aerobic and resistive training based on initial evaluation findings, risk stratification, comorbidities and participant's personal goals.       Expected Outcomes  Short Term: Increase workloads from initial exercise prescription for resistance, speed, and METs.;Short Term: Perform resistance training exercises routinely during rehab and add in resistance training at home;Long Term: Improve cardiorespiratory fitness, muscular endurance and strength as measured by increased METs and functional capacity (6MWT)       Able to understand and use rate of perceived exertion (RPE) scale  Yes       Intervention  Provide education and explanation on how to use RPE scale       Expected Outcomes  Long Term:  Able to use RPE to guide intensity level when exercising independently;Short Term: Able to use RPE daily in rehab to express subjective intensity level       Able to understand and use Dyspnea scale  Yes       Intervention  Provide education and explanation on how to use Dyspnea scale       Expected Outcomes  Short Term: Able to use Dyspnea scale daily in rehab to express subjective sense of shortness of breath during exertion;Long Term: Able to use Dyspnea scale to guide intensity level when exercising independently       Knowledge and understanding of Target Heart Rate Range (THRR)  Yes       Intervention  Provide education and explanation of THRR including how the numbers were  predicted and where they are located for reference       Expected Outcomes  Short Term: Able to use daily as guideline for intensity in rehab;Long Term: Able to use THRR to govern intensity when exercising independently;Short Term: Able to state/look up THRR       Able to check pulse independently  Yes       Intervention  Review the importance of being able to check your own pulse for safety during independent exercise;Provide education and demonstration on how to check pulse in  carotid and radial arteries.       Expected Outcomes  Short Term: Able to explain why pulse checking is important during independent exercise;Long Term: Able to check pulse independently and accurately       Understanding of Exercise Prescription  Yes       Intervention  Provide education, explanation, and written materials on patient's individual exercise prescription       Expected Outcomes  Short Term: Able to explain program exercise prescription;Long Term: Able to explain home exercise prescription to exercise independently          Exercise Goals Re-Evaluation :   Discharge Exercise Prescription (Final Exercise Prescription Changes): Exercise Prescription Changes - 06/19/19 1100      Response to Exercise   Blood Pressure (Admit)  108/64    Blood Pressure (Exercise)  134/54    Blood Pressure (Exit)  126/60    Heart Rate (Admit)  63 bpm    Heart Rate (Exercise)  103 bpm    Heart Rate (Exit)  64 bpm    Oxygen Saturation (Admit)  92 %    Oxygen Saturation (Exercise)  90 %    Oxygen Saturation (Exit)  96 %    Rating of Perceived Exertion (Exercise)  11    Perceived Dyspnea (Exercise)  2    Symptoms  SOB, fatigue at end    Comments  walk test results       Nutrition:  Target Goals: Understanding of nutrition guidelines, daily intake of sodium <1561m, cholesterol <2031m calories 30% from fat and 7% or less from saturated fats, daily to have 5 or more servings of fruits and vegetables.  Biometrics: Pre Biometrics - 06/19/19 1112      Pre Biometrics   Height  6' 3.6" (1.92 m)    Weight  223 lb (101.2 kg)    BMI (Calculated)  27.44    Single Leg Stand  2.34 seconds        Nutrition Therapy Plan and Nutrition Goals:   Nutrition Assessments: Nutrition Assessments - 06/19/19 1111      MEDFICTS Scores   Pre Score  39       Nutrition Goals Re-Evaluation:   Nutrition Goals Discharge (Final Nutrition Goals Re-Evaluation):   Psychosocial: Target Goals:  Acknowledge presence or absence of significant depression and/or stress, maximize coping skills, provide positive support system. Participant is able to verbalize types and ability to use techniques and skills needed for reducing stress and depression.   Initial Review & Psychosocial Screening: Initial Psych Review & Screening - 06/14/19 1038      Initial Review   Current issues with  Current Sleep Concerns;Current Stress Concerns    Source of Stress Concerns  Chronic Illness    Comments  Sometimes he cannot sleep well. He wakes up with aches and pains at times.      Family Dynamics   Good Support System?  Yes    Comments  He can  look to his wife, daughters and 4 grandchildren for support.      Barriers   Psychosocial barriers to participate in program  The patient should benefit from training in stress management and relaxation.      Screening Interventions   Interventions  Encouraged to exercise;To provide support and resources with identified psychosocial needs;Provide feedback about the scores to participant    Expected Outcomes  Short Term goal: Utilizing psychosocial counselor, staff and physician to assist with identification of specific Stressors or current issues interfering with healing process. Setting desired goal for each stressor or current issue identified.;Long Term Goal: Stressors or current issues are controlled or eliminated.;Short Term goal: Identification and review with participant of any Quality of Life or Depression concerns found by scoring the questionnaire.;Long Term goal: The participant improves quality of Life and PHQ9 Scores as seen by post scores and/or verbalization of changes       Quality of Life Scores:  Scores of 19 and below usually indicate a poorer quality of life in these areas.  A difference of  2-3 points is a clinically meaningful difference.  A difference of 2-3 points in the total score of the Quality of Life Index has been associated with  significant improvement in overall quality of life, self-image, physical symptoms, and general health in studies assessing change in quality of life.  PHQ-9: Recent Review Flowsheet Data    Depression screen [facility] 2/9 06/19/2019   Decreased Interest 0   Down, Depressed, Hopeless 0   PHQ - 2 Score 0   Altered sleeping 1   Tired, decreased energy 0   Change in appetite 1   Feeling bad or failure about yourself  0   Trouble concentrating 0   Moving slowly or fidgety/restless 0   Suicidal thoughts 0   PHQ-9 Score 2   Difficult doing work/chores Not difficult at all     Interpretation of Total Score  Total Score Depression Severity:  1-4 = Minimal depression, 5-9 = Mild depression, 10-14 = Moderate depression, 15-19 = Moderately severe depression, 20-27 = Severe depression   Psychosocial Evaluation and Intervention: Psychosocial Evaluation - 06/14/19 1042      Psychosocial Evaluation & Interventions   Interventions  Encouraged to exercise with the program and follow exercise prescription    Comments  patient has a positive outlook on his health and is read to start pulmonary rehab.    Expected Outcomes  Short: Attend [facility] stress management education to decrease stress. Long: Maintain exercise Post [facility] to keep stress at a minimum.    Continue Psychosocial Services   Follow up required by staff       Psychosocial Re-Evaluation:   Psychosocial Discharge (Final Psychosocial Re-Evaluation):   Education: Education Goals: Education classes will be provided on a weekly basis, covering required topics. Participant will state understanding/return demonstration of topics presented.  Learning Barriers/Preferences: Learning Barriers/Preferences - 06/14/19 1039      Learning Barriers/Preferences   Learning Barriers  None    Learning Preferences  None       Education Topics:  Initial Evaluation Education: - Verbal, written and demonstration of respiratory meds, oximetry and  breathing techniques. Instruction on use of nebulizers and MDIs and importance of monitoring MDI activations.   General Nutrition Guidelines/Fats and Fiber: -Group instruction provided by verbal, written material, models and posters to present the general guidelines for heart healthy nutrition. Gives an explanation and review of dietary fats and fiber.   Controlling Sodium/Reading Food Labels: -Group  verbal and written material supporting the discussion of sodium use in heart healthy nutrition. Review and explanation with models, verbal and written materials for utilization of the food label.   Exercise Physiology & General Exercise Guidelines: - Group verbal and written instruction with models to review the exercise physiology of the cardiovascular system and associated critical values. Provides general exercise guidelines with specific guidelines to those with heart or lung disease.    Aerobic Exercise & Resistance Training: - Gives group verbal and written instruction on the various components of exercise. Focuses on aerobic and resistive training programs and the benefits of this training and how to safely progress through these programs.   Flexibility, Balance, Mind/Body Relaxation: Provides group verbal/written instruction on the benefits of flexibility and balance training, including mind/body exercise modes such as yoga, pilates and tai chi.  Demonstration and skill practice provided.   Stress and Anxiety: - Provides group verbal and written instruction about the health risks of elevated stress and causes of high stress.  Discuss the correlation between heart/lung disease and anxiety and treatment options. Review healthy ways to manage with stress and anxiety.   Depression: - Provides group verbal and written instruction on the correlation between heart/lung disease and depressed mood, treatment options, and the stigmas associated with seeking treatment.   Exercise & Equipment  Safety: - Individual verbal instruction and demonstration of equipment use and safety with use of the equipment.   Pulmonary Rehab from 06/14/2019 in [facility] Cardiac and Pulmonary Rehab  Date  06/14/19  Educator  [facility]  Instruction Review Code  1- Verbalizes Understanding      Infection Prevention: - Provides verbal and written material to individual with discussion of infection control including proper hand washing and proper equipment cleaning during exercise session.   Pulmonary Rehab from 06/14/2019 in [facility] Cardiac and Pulmonary Rehab  Date  06/14/19  Educator  [facility]  Instruction Review Code  1- Verbalizes Understanding      Falls Prevention: - Provides verbal and written material to individual with discussion of falls prevention and safety.   Pulmonary Rehab from 06/14/2019 in [facility] Cardiac and Pulmonary Rehab  Date  06/14/19  Educator  [facility]  Instruction Review Code  1- Verbalizes Understanding      Diabetes: - Individual verbal and written instruction to review signs/symptoms of diabetes, desired ranges of glucose level fasting, after meals and with exercise. Advice that pre and post exercise glucose checks will be done for 3 sessions at entry of program.   Chronic Lung Diseases: - Group verbal and written instruction to review updates, respiratory medications, advancements in procedures and treatments. Discuss use of supplemental oxygen including available portable oxygen systems, continuous and intermittent flow rates, concentrators, personal use and safety guidelines. Review proper use of inhaler and spacers. Provide informative websites for self-education.    Energy Conservation: - Provide group verbal and written instruction for methods to conserve energy, plan and organize activities. Instruct on pacing techniques, use of adaptive equipment and posture/positioning to relieve shortness of breath.   Triggers and Exacerbations: - Group verbal and written instruction to review types  of environmental triggers and ways to prevent exacerbations. Discuss weather changes, air quality and the benefits of nasal washing. Review warning signs and symptoms to help prevent infections. Discuss techniques for effective airway clearance, coughing, and vibrations.   AED/CPR: - Group verbal and written instruction with the use of models to demonstrate the basic use of the AED with the basic ABC's of resuscitation.  Anatomy and Physiology of the Lungs: - Group verbal and written instruction with the use of models to provide basic lung anatomy and physiology related to function, structure and complications of lung disease.   Anatomy & Physiology of the Heart: - Group verbal and written instruction and models provide basic cardiac anatomy and physiology, with the coronary electrical and arterial systems. Review of Valvular disease and Heart Failure   Cardiac Medications: - Group verbal and written instruction to review commonly prescribed medications for heart disease. Reviews the medication, class of the drug, and side effects.   Know Your Numbers and Risk Factors: -Group verbal and written instruction about important numbers in your health.  Discussion of what are risk factors and how they play a role in the disease process.  Review of Cholesterol, Blood Pressure, Diabetes, and BMI and the role they play in your overall health.   Sleep Hygiene: -Provides group verbal and written instruction about how sleep can affect your health.  Define sleep hygiene, discuss sleep cycles and impact of sleep habits. Review good sleep hygiene tips.    Other: -Provides group and verbal instruction on various topics (see comments)    Knowledge Questionnaire Score: Knowledge Questionnaire Score - 06/19/19 1111      Knowledge Questionnaire Score   Pre Score  17/18 Education Focus: O2 safety        Core Components/Risk Factors/Patient Goals at Admission: Personal Goals and Risk Factors at  Admission - 06/19/19 1111      Core Components/Risk Factors/Patient Goals on Admission    Weight Management  Yes;Weight Loss    Intervention  Weight Management: Develop a combined nutrition and exercise program designed to reach desired caloric intake, while maintaining appropriate intake of nutrient and fiber, sodium and fats, and appropriate energy expenditure required for the weight goal.;Weight Management: Provide education and appropriate resources to help participant work on and attain dietary goals.    Admit Weight  223 lb (101.2 kg)    Goal Weight: Short Term  218 lb (98.9 kg)    Goal Weight: Long Term  208 lb (94.3 kg)    Expected Outcomes  Short Term: Continue to assess and modify interventions until short term weight is achieved;Long Term: Adherence to nutrition and physical activity/exercise program aimed toward attainment of established weight goal;Weight Loss: Understanding of general recommendations for a balanced deficit meal plan, which promotes 1-2 lb weight loss per week and includes a negative energy balance of [phone] kcal/d;Understanding recommendations for meals to include 15-35% energy as protein, 25-35% energy from fat, 35-60% energy from carbohydrates, less than 2103m of dietary cholesterol, 20-35 gm of total fiber daily;Understanding of distribution of calorie intake throughout the day with the consumption of 4-5 meals/snacks    Improve shortness of breath with ADL's  Yes    Intervention  Provide education, individualized exercise plan and daily activity instruction to help decrease symptoms of SOB with activities of daily living.    Expected Outcomes  Short Term: Improve cardiorespiratory fitness to achieve a reduction of symptoms when performing ADLs;Long Term: Be able to perform more ADLs without symptoms or delay the onset of symptoms    Heart Failure  Yes    Intervention  Provide a combined exercise and nutrition program that is supplemented with education, support and  counseling about heart failure. Directed toward relieving symptoms such as shortness of breath, decreased exercise tolerance, and extremity edema.    Expected Outcomes  Improve functional capacity of life;Short term: Attendance in program 2-3  days a week with increased exercise capacity. Reported lower sodium intake. Reported increased fruit and vegetable intake. Reports medication compliance.;Short term: Daily weights obtained and reported for increase. Utilizing diuretic protocols set by physician.;Long term: Adoption of self-care skills and reduction of barriers for early signs and symptoms recognition and intervention leading to self-care maintenance.    Hypertension  Yes    Intervention  Provide education on lifestyle modifcations including regular physical activity/exercise, weight management, moderate sodium restriction and increased consumption of fresh fruit, vegetables, and low fat dairy, alcohol moderation, and smoking cessation.;Monitor prescription use compliance.    Expected Outcomes  Short Term: Continued assessment and intervention until BP is < 140/16m HG in hypertensive participants. < 130/847mHG in hypertensive participants with diabetes, heart failure or chronic kidney disease.;Long Term: Maintenance of blood pressure at goal levels.       Core Components/Risk Factors/Patient Goals Review:    Core Components/Risk Factors/Patient Goals at Discharge (Final Review):    ITP Comments: ITP Comments    Row Name 06/14/19 1045 06/19/19 1100 06/20/19 0603       ITP Comments  Virtual Orientation performed. Patient informed when to come in for RD and EP orientation. [facility]  Completed 6MWT and gym orientation.  Initial ITP created and sent for review to Dr. MaEmily FilbertMedical Director.  30 day chart review completed. ITP sent to Dr M Zachery Dakinsedical Director, for review,changes as needed and signature. Continue with ITP if no changes requested New  to program        Comments:

## 2019-06-21 ENCOUNTER — Encounter: Payer: Medicare Other | Admitting: *Deleted

## 2019-06-21 ENCOUNTER — Other Ambulatory Visit: Payer: Self-pay

## 2019-06-21 DIAGNOSIS — J869 Pyothorax without fistula: Secondary | ICD-10-CM

## 2019-06-21 NOTE — Progress Notes (Signed)
Daily Session Note  Patient Details  Name: Ricky Petersen MRN: [id number] Date of Birth: Jun 25, 1945 Referring Provider:     Pulmonary Rehab from 06/19/2019 in [facility] Cardiac and Pulmonary Rehab  Referring Provider  Susa Simmonds MD      Encounter Date: 06/21/2019  Check In: Session Check In - 06/21/19 0820      Check-In   Supervising physician immediately available to respond to emergencies  See telemetry face sheet for immediately available ER MD    Location  [facility]-Cardiac & Pulmonary Rehab    Staff Present  Heath Lark, RN, BSN, CCRP;Jessica [place], MA, RCEP, CCRP, CCET    Virtual Visit  No    Medication changes reported      No    Fall or balance concerns reported     No    Warm-up and Cool-down  Performed on first and last piece of equipment    Resistance Training Performed  Yes    VAD Patient?  No    PAD/SET Patient?  No      Pain Assessment   Currently in Pain?  No/denies          Social History   Tobacco Use  Smoking Status Former Smoker  . Packs/day: 1.00  . Years: 15.00  . Pack years: 15.00  . Types: Cigarettes  . Quit date: 55  . Years since quitting: 41.2  Smokeless Tobacco Never Used    Goals Met:  Exercise tolerated well Personal goals reviewed No report of cardiac concerns or symptoms  Goals Unmet:  Not Applicable  Comments: First full day of exercise!  Patient was oriented to gym and equipment including functions, settings, policies, and procedures.  Patient's individual exercise prescription and treatment plan were reviewed.  All starting workloads were established based on the results of the 6 minute walk test done at initial orientation visit.  The plan for exercise progression was also introduced and progression will be customized based on patient's performance and goals.    Dr. Emily Filbert is Medical Director for [place] and [facility].

## 2019-06-26 ENCOUNTER — Encounter: Payer: Medicare Other | Admitting: *Deleted

## 2019-06-26 ENCOUNTER — Other Ambulatory Visit: Payer: Self-pay

## 2019-06-26 DIAGNOSIS — J869 Pyothorax without fistula: Secondary | ICD-10-CM

## 2019-06-26 DIAGNOSIS — J439 Emphysema, unspecified: Secondary | ICD-10-CM | POA: Diagnosis not present

## 2019-06-26 NOTE — Progress Notes (Signed)
Daily Session Note  Patient Details  Name: Ricky Petersen MRN: [id number] Date of Birth: January 14, 1946 Referring Provider:     Pulmonary Rehab from 06/19/2019 in [facility] Cardiac and Pulmonary Rehab  Referring Provider  Susa Simmonds MD      Encounter Date: 06/26/2019  Check In: Session Check In - 06/26/19 0936      Check-In   Supervising physician immediately available to respond to emergencies  See telemetry face sheet for immediately available ER MD    Location  [facility]-Cardiac & Pulmonary Rehab    Staff Present  Heath Lark, RN, BSN, CCRP;Jessica [place], MA, RCEP, CCRP, CCET;Joseph Truesdale RCP,RRT,BSRT    Virtual Visit  No    Medication changes reported      No    Fall or balance concerns reported     No    Warm-up and Cool-down  Performed on first and last piece of equipment    Resistance Training Performed  Yes    VAD Patient?  No    PAD/SET Patient?  No      Pain Assessment   Currently in Pain?  No/denies          Social History   Tobacco Use  Smoking Status Former Smoker  . Packs/day: 1.00  . Years: 15.00  . Pack years: 15.00  . Types: Cigarettes  . Quit date: 8  . Years since quitting: 41.2  Smokeless Tobacco Never Used    Goals Met:  Proper associated with RPD/PD & O2 Sat Independence with exercise equipment Exercise tolerated well No report of cardiac concerns or symptoms  Goals Unmet:  Not Applicable  Comments: Pt able to follow exercise prescription today without complaint.  Will continue to monitor for progression.    Dr. Emily Filbert is Medical Director for [place] and [facility].

## 2019-06-28 ENCOUNTER — Encounter: Payer: Medicare Other | Attending: Pulmonary Disease | Admitting: *Deleted

## 2019-06-28 ENCOUNTER — Other Ambulatory Visit: Payer: Self-pay

## 2019-06-28 DIAGNOSIS — I110 Hypertensive heart disease with heart failure: Secondary | ICD-10-CM | POA: Insufficient documentation

## 2019-06-28 DIAGNOSIS — J439 Emphysema, unspecified: Secondary | ICD-10-CM | POA: Diagnosis present

## 2019-06-28 DIAGNOSIS — K219 Gastro-esophageal reflux disease without esophagitis: Secondary | ICD-10-CM | POA: Diagnosis not present

## 2019-06-28 DIAGNOSIS — J869 Pyothorax without fistula: Secondary | ICD-10-CM

## 2019-06-28 DIAGNOSIS — Z87891 Personal history of nicotine dependence: Secondary | ICD-10-CM | POA: Insufficient documentation

## 2019-06-28 DIAGNOSIS — Z7982 Long term (current) use of aspirin: Secondary | ICD-10-CM | POA: Diagnosis not present

## 2019-06-28 DIAGNOSIS — Z7901 Long term (current) use of anticoagulants: Secondary | ICD-10-CM | POA: Insufficient documentation

## 2019-06-28 DIAGNOSIS — Z79899 Other long term (current) drug therapy: Secondary | ICD-10-CM | POA: Diagnosis not present

## 2019-06-28 NOTE — Progress Notes (Signed)
Daily Session Note  Patient Details  Name: Ricky Petersen MRN: [id number] Date of Birth: [birth year] Referring Provider:     Pulmonary Rehab from 06/19/2019 in [facility] Cardiac and Pulmonary Rehab  Referring Provider  Susa Simmonds MD      Encounter Date: 06/28/2019  Check In: Session Check In - 06/28/19 0809      Check-In   Supervising physician immediately available to respond to emergencies  See telemetry face sheet for immediately available ER MD    Location  [facility]-Cardiac & Pulmonary Rehab    Staff Present  Nyoka Cowden, RN, BSN, [place], BS, RRT, CPFT;Susanne Bice, RN, BSN, CCRP    Virtual Visit  No    Medication changes reported      No    Fall or balance concerns reported     No    Warm-up and Cool-down  Performed on first and last piece of equipment    Resistance Training Performed  Yes    VAD Patient?  No    PAD/SET Patient?  No          Social History   Tobacco Use  Smoking Status Former Smoker  . Packs/day: 1.00  . Years: 15.00  . Pack years: 15.00  . Types: Cigarettes  . Quit date: 37  . Years since quitting: 41.2  Smokeless Tobacco Never Used    Goals Met:  Independence with exercise equipment Exercise tolerated well No report of cardiac concerns or symptoms  Goals Unmet:  Not Applicable  Comments: Pt able to follow exercise prescription today without complaint.  Will continue to monitor for progression.   Dr. Emily Filbert is Medical Director for [place] and [facility].

## 2019-07-03 ENCOUNTER — Encounter: Payer: Medicare Other | Admitting: *Deleted

## 2019-07-03 ENCOUNTER — Other Ambulatory Visit: Payer: Self-pay

## 2019-07-03 DIAGNOSIS — J439 Emphysema, unspecified: Secondary | ICD-10-CM | POA: Diagnosis not present

## 2019-07-03 DIAGNOSIS — J869 Pyothorax without fistula: Secondary | ICD-10-CM

## 2019-07-03 NOTE — Progress Notes (Signed)
Daily Session Note  Patient Details  Name: Ricky Petersen MRN: [id number] Date of Birth: 1945/07/31 Referring Provider:     Pulmonary Rehab from 06/19/2019 in [facility] Cardiac and Pulmonary Rehab  Referring Provider  Susa Simmonds MD      Encounter Date: 07/03/2019  Check In: Session Check In - 07/03/19 0816      Check-In   Supervising physician immediately available to respond to emergencies  See telemetry face sheet for immediately available ER MD    Location  [facility]-Cardiac & Pulmonary Rehab    Staff Present  Heath Lark, RN, BSN, CCRP;Amanda Sommer, BA, ACSM CEP, Exercise Physiologist;Joseph Hood RCP,RRT,BSRT    Virtual Visit  No    Medication changes reported      No    Fall or balance concerns reported     No    Warm-up and Cool-down  Performed on first and last piece of equipment    Resistance Training Performed  Yes    VAD Patient?  No    PAD/SET Patient?  No      Pain Assessment   Currently in Pain?  No/denies          Social History   Tobacco Use  Smoking Status Former Smoker  . Packs/day: 1.00  . Years: 15.00  . Pack years: 15.00  . Types: Cigarettes  . Quit date: 63  . Years since quitting: 41.2  Smokeless Tobacco Never Used    Goals Met:  Proper associated with RPD/PD & O2 Sat Exercise tolerated well No report of cardiac concerns or symptoms  Goals Unmet:  Not Applicable  Comments: Pt able to follow exercise prescription today without complaint.  Will continue to monitor for progression.    Dr. Emily Filbert is Medical Director for [place] and [facility].

## 2019-07-04 ENCOUNTER — Other Ambulatory Visit: Payer: Self-pay

## 2019-07-04 DIAGNOSIS — J869 Pyothorax without fistula: Secondary | ICD-10-CM

## 2019-07-04 NOTE — Progress Notes (Signed)
Completed Initial RD Evaluation 

## 2019-07-05 ENCOUNTER — Other Ambulatory Visit: Payer: Self-pay

## 2019-07-05 DIAGNOSIS — J869 Pyothorax without fistula: Secondary | ICD-10-CM

## 2019-07-05 DIAGNOSIS — J439 Emphysema, unspecified: Secondary | ICD-10-CM | POA: Diagnosis not present

## 2019-07-05 NOTE — Progress Notes (Signed)
Daily Session Note  Patient Details  Name: Ricky Petersen MRN: [id number] Date of Birth: [birth year] Referring Provider:     Pulmonary Rehab from 06/19/2019 in [facility] Cardiac and Pulmonary Rehab  Referring Provider  Susa Simmonds MD      Encounter Date: 07/05/2019  Check In: Session Check In - 07/05/19 6579      Check-In   Supervising physician immediately available to respond to emergencies  See telemetry face sheet for immediately available ER MD    Location  [facility]-Cardiac & Pulmonary Rehab    Staff Present  Vida Rigger RN, Vickki Hearing, BA, ACSM CEP, Exercise Physiologist;Susanne Bice, RN, BSN, CCRP;Melissa Caiola RDN, LDN    Virtual Visit  No    Medication changes reported      No    Fall or balance concerns reported     No    Warm-up and Cool-down  Performed on first and last piece of equipment    Resistance Training Performed  Yes    VAD Patient?  No    PAD/SET Patient?  No      Pain Assessment   Currently in Pain?  No/denies          Social History   Tobacco Use  Smoking Status Former Smoker  . Packs/day: 1.00  . Years: 15.00  . Pack years: 15.00  . Types: Cigarettes  . Quit date: 44  . Years since quitting: 41.2  Smokeless Tobacco Never Used    Goals Met:  Proper associated with RPD/PD & O2 Sat Independence with exercise equipment Exercise tolerated well No report of cardiac concerns or symptoms Strength training completed today  Goals Unmet:  Not Applicable  Comments: Pt able to follow exercise prescription today without complaint.  Will continue to monitor for progression.   Dr. Emily Filbert is Medical Director for [place] and [facility].

## 2019-07-10 ENCOUNTER — Encounter: Payer: Medicare Other | Admitting: *Deleted

## 2019-07-10 ENCOUNTER — Other Ambulatory Visit: Payer: Self-pay

## 2019-07-10 DIAGNOSIS — J869 Pyothorax without fistula: Secondary | ICD-10-CM

## 2019-07-10 DIAGNOSIS — J439 Emphysema, unspecified: Secondary | ICD-10-CM | POA: Diagnosis not present

## 2019-07-10 NOTE — Progress Notes (Signed)
Daily Session Note  Patient Details  Name: Ricky Petersen MRN: [id number] Date of Birth: 1945-04-17 Referring Provider:     Pulmonary Rehab from 06/19/2019 in [facility] Cardiac and Pulmonary Rehab  Referring Provider  Susa Simmonds MD      Encounter Date: 07/10/2019  Check In: Session Check In - 07/10/19 0829      Check-In   Supervising physician immediately available to respond to emergencies  See telemetry face sheet for immediately available ER MD    Location  [facility]-Cardiac & Pulmonary Rehab    Staff Present  Heath Lark, RN, BSN, CCRP;Amanda Sommer, BA, ACSM CEP, Exercise Physiologist;Joseph Hood RCP,RRT,BSRT    Virtual Visit  No    Medication changes reported      No    Fall or balance concerns reported     No    Warm-up and Cool-down  Performed on first and last piece of equipment    Resistance Training Performed  Yes    VAD Patient?  No    PAD/SET Patient?  No      Pain Assessment   Currently in Pain?  No/denies          Social History   Tobacco Use  Smoking Status Former Smoker  . Packs/day: 1.00  . Years: 15.00  . Pack years: 15.00  . Types: Cigarettes  . Quit date: 66  . Years since quitting: 41.3  Smokeless Tobacco Never Used    Goals Met:  Proper associated with RPD/PD & O2 Sat Independence with exercise equipment Exercise tolerated well No report of cardiac concerns or symptoms  Goals Unmet:  Not Applicable  Comments: Pt able to follow exercise prescription today without complaint.  Will continue to monitor for progression.    Dr. Emily Filbert is Medical Director for [place] and [facility].

## 2019-07-12 ENCOUNTER — Other Ambulatory Visit: Payer: Self-pay

## 2019-07-12 ENCOUNTER — Encounter: Payer: Medicare Other | Admitting: *Deleted

## 2019-07-12 DIAGNOSIS — J869 Pyothorax without fistula: Secondary | ICD-10-CM

## 2019-07-12 DIAGNOSIS — J439 Emphysema, unspecified: Secondary | ICD-10-CM | POA: Diagnosis not present

## 2019-07-12 NOTE — Progress Notes (Signed)
Daily Session Note  Patient Details  Name: Ricky Petersen MRN: [id number] Date of Birth: 14-Jun-1945 Referring Provider:     Pulmonary Rehab from 06/19/2019 in [facility] Cardiac and Pulmonary Rehab  Referring Provider  Susa Simmonds MD      Encounter Date: 07/12/2019  Check In: Session Check In - 07/12/19 0836      Check-In   Supervising physician immediately available to respond to emergencies  See telemetry face sheet for immediately available ER MD    Location  [facility]-Cardiac & Pulmonary Rehab    Staff Present  Heath Lark, RN, BSN, CCRP;Laureen Owens Shark, BS, RRT, CPFT;Amanda Sommer, BA, ACSM CEP, Exercise Physiologist;Jessica [place], MA, RCEP, CCRP, CCET    Virtual Visit  No    Medication changes reported      No    Fall or balance concerns reported     No    Warm-up and Cool-down  Performed on first and last piece of equipment    Resistance Training Performed  Yes    VAD Patient?  No    PAD/SET Patient?  No      Pain Assessment   Currently in Pain?  No/denies          Social History   Tobacco Use  Smoking Status Former Smoker  . Packs/day: 1.00  . Years: 15.00  . Pack years: 15.00  . Types: Cigarettes  . Quit date: 74  . Years since quitting: 41.3  Smokeless Tobacco Never Used    Goals Met:  Independence with exercise equipment Exercise tolerated well No report of cardiac concerns or symptoms  Goals Unmet:  Not Applicable  Comments: Pt able to follow exercise prescription today without complaint.  Will continue to monitor for progression.    Dr. Emily Filbert is Medical Director for [place] and [facility].

## 2019-07-17 ENCOUNTER — Encounter: Payer: Medicare Other | Admitting: *Deleted

## 2019-07-17 ENCOUNTER — Other Ambulatory Visit: Payer: Self-pay

## 2019-07-17 DIAGNOSIS — J869 Pyothorax without fistula: Secondary | ICD-10-CM

## 2019-07-17 DIAGNOSIS — J439 Emphysema, unspecified: Secondary | ICD-10-CM | POA: Diagnosis not present

## 2019-07-17 NOTE — Progress Notes (Signed)
Daily Session Note  Patient Details  Name: Ricky Petersen MRN: [id number] Date of Birth: 1945-06-16 Referring Provider:     Pulmonary Rehab from 06/19/2019 in [facility] Cardiac and Pulmonary Rehab  Referring Provider  Susa Simmonds MD      Encounter Date: 07/17/2019  Check In: Session Check In - 07/17/19 0900      Check-In   Supervising physician immediately available to respond to emergencies  See telemetry face sheet for immediately available ER MD    Location  [facility]-Cardiac & Pulmonary Rehab    Staff Present  Heath Lark, RN, BSN, CCRP;Amanda Sommer, BA, ACSM CEP, Exercise Physiologist;Joseph Hood RCP,RRT,BSRT    Virtual Visit  No    Medication changes reported      No    Fall or balance concerns reported     No    Warm-up and Cool-down  Performed on first and last piece of equipment    Resistance Training Performed  Yes    VAD Patient?  No    PAD/SET Patient?  No      Pain Assessment   Currently in Pain?  No/denies          Social History   Tobacco Use  Smoking Status Former Smoker  . Packs/day: 1.00  . Years: 15.00  . Pack years: 15.00  . Types: Cigarettes  . Quit date: 93  . Years since quitting: 41.3  Smokeless Tobacco Never Used    Goals Met:  Proper associated with RPD/PD & O2 Sat Independence with exercise equipment Exercise tolerated well No report of cardiac concerns or symptoms  Goals Unmet:  Not Applicable  Comments: Pt able to follow exercise prescription today without complaint.  Will continue to monitor for progression.    Dr. Emily Filbert is Medical Director for [place] and [facility].

## 2019-07-18 ENCOUNTER — Encounter: Payer: Self-pay | Admitting: *Deleted

## 2019-07-18 DIAGNOSIS — J869 Pyothorax without fistula: Secondary | ICD-10-CM

## 2019-07-18 NOTE — Progress Notes (Signed)
Pulmonary Individual Treatment Plan  Patient Details  Name: Ricky Petersen MRN: [id number] Date of Birth: 1945-08-21 Referring Provider:     Pulmonary Rehab from 06/19/2019 in [facility] Cardiac and Pulmonary Rehab  Referring Provider  Susa Simmonds MD      Initial Encounter Date:    Pulmonary Rehab from 06/19/2019 in [facility] Cardiac and Pulmonary Rehab  Date  06/19/19      Visit Diagnosis: Empyema ([place])  Patient's Home Medications on Admission:  Current Outpatient Medications:  .  aspirin EC 81 MG tablet, Take 81 mg by mouth daily., Disp: , Rfl:  .  Eliquis DVT/PE Starter Pack (ELIQUIS STARTER PACK) 5 MG TABS, Take as directed on package: start with two-56m tablets twice daily for 7 days. On day 8, switch to one-589mtablet twice daily., Disp: 1 each, Rfl: 0 .  fluticasone (FLONASE) 50 MCG/ACT nasal spray, Place 2 sprays into both nostrils daily., Disp: , Rfl:  .  HYDROcodone-acetaminophen (NORCO/VICODIN) 5-325 MG tablet, Take 1 tablet by mouth 3 (three) times daily as needed for severe pain. (Patient not taking: Reported on 06/14/2019), Disp: 15 tablet, Rfl: 0 .  losartan (COZAAR) 100 MG tablet, Take 150 mg by mouth daily. , Disp: , Rfl:  .  metoprolol succinate (TOPROL-XL) 50 MG 24 hr tablet, Take 50 mg by mouth daily., Disp: , Rfl:  .  omeprazole (PRILOSEC) 20 MG capsule, Take 20 mg by mouth 2 (two) times daily before a meal., Disp: , Rfl:   Past Medical History: Past Medical History:  Diagnosis Date  . COVID-19   . GERD (gastroesophageal reflux disease)   . Hypertension   . Idiopathic cardiomyopathy ([place]  . Sleep apnea     Tobacco Use: Social History   Tobacco Use  Smoking Status Former Smoker  . Packs/day: 1.00  . Years: 15.00  . Pack years: 15.00  . Types: Cigarettes  . Quit date: 1936. Years since quitting: 41.3  Smokeless Tobacco Never Used    Labs: Recent Review Flowsheet Data    Labs for ITP Cardiac and Pulmonary Rehab 10/26/2006   HCO3 26.4(H)   TCO2 28        Pulmonary Assessment Scores: Pulmonary Assessment Scores    Row Name 06/19/19 1113         ADL UCSD   ADL Phase  Entry     SOB Score total  26     Rest  0     Walk  2     Stairs  3     Bath  0     Dress  0     Shop  2       CAT Score   CAT Score  14       mMRC Score   mMRC Score  2        UCSD: Self-administered rating of dyspnea associated with activities of daily living (ADLs) 6-point scale (0 = "not at all" to 5 = "maximal or unable to do because of breathlessness")  Scoring Scores range from 0 to 120.  Minimally important difference is 5 units  CAT: CAT can identify the health impairment of COPD patients and is better correlated with disease progression.  CAT has a scoring range of zero to 40. The CAT score is classified into four groups of low (less than 10), medium (10 - 20), high (21-30) and very high (31-40) based on the impact level of disease on health status. A CAT score over 10  suggests significant symptoms.  A worsening CAT score could be explained by an exacerbation, poor medication adherence, poor inhaler technique, or progression of COPD or comorbid conditions.  CAT MCID is 2 points  mMRC: mMRC (Modified Medical Research Council) Dyspnea Scale is used to assess the degree of baseline functional disability in patients of respiratory disease due to dyspnea. No minimal important difference is established. A decrease in score of 1 point or greater is considered a positive change.   Pulmonary Function Assessment: Pulmonary Function Assessment - 06/19/19 1113      Breath   Shortness of Breath  Yes;Fear of Shortness of Breath;Limiting activity       Exercise Target Goals: Exercise Program Goal: Individual exercise prescription set using results from initial 6 min walk test and THRR while considering  patient's activity barriers and safety.   Exercise Prescription Goal: Initial exercise prescription builds to 30-45 minutes a day of aerobic activity,  2-3 days per week.  Home exercise guidelines will be given to patient during program as part of exercise prescription that the participant will acknowledge.  Education: Aerobic Exercise & Resistance Training: - Gives group verbal and written instruction on the various components of exercise. Focuses on aerobic and resistive training programs and the benefits of this training and how to safely progress through these programs..   Education: Exercise & Equipment Safety: - Individual verbal instruction and demonstration of equipment use and safety with use of the equipment.   Pulmonary Rehab from 06/14/2019 in [facility] Cardiac and Pulmonary Rehab  Date  06/14/19  Educator  [facility]  Instruction Review Code  1- Verbalizes Understanding      Education: Exercise Physiology & General Exercise Guidelines: - Group verbal and written instruction with models to review the exercise physiology of the cardiovascular system and associated critical values. Provides general exercise guidelines with specific guidelines to those with heart or lung disease.    Education: Flexibility, Balance, Mind/Body Relaxation: Provides group verbal/written instruction on the benefits of flexibility and balance training, including mind/body exercise modes such as yoga, pilates and tai chi.  Demonstration and skill practice provided.   Activity Barriers & Risk Stratification: Activity Barriers & Cardiac Risk Stratification - 06/19/19 1108      Activity Barriers & Cardiac Risk Stratification   Activity Barriers  Balance Concerns;Deconditioning;Muscular Weakness;Other (comment);Joint Problems    Comments  numbness in both feet, L shoulder limited ROM from lung surgery       6 Minute Walk: 6 Minute Walk    Row Name 06/19/19 1100         6 Minute Walk   Phase  Initial     Distance  1435 feet     Walk Time  6 minutes     # of Rest Breaks  0     MPH  2.72     METS  3.1     RPE  11     Perceived Dyspnea   2     VO2 Peak   10.84     Symptoms  Yes (comment)     Comments  SOB, fatigue at end     Resting HR  63 bpm     Resting BP  108/64     Resting Oxygen Saturation   92 %     Exercise Oxygen Saturation  during 6 min walk  90 %     Max Ex. HR  103 bpm     Max Ex. BP  134/54  2 Minute Post BP  126/54       Interval HR   1 Minute HR  93     2 Minute HR  102     3 Minute HR  103     4 Minute HR  102     5 Minute HR  101     6 Minute HR  99     2 Minute Post HR  65     Interval Heart Rate?  Yes       Interval Oxygen   Interval Oxygen?  Yes     Baseline Oxygen Saturation %  92 %     1 Minute Oxygen Saturation %  95 %     1 Minute Liters of Oxygen  0 L Room Air     2 Minute Oxygen Saturation %  94 %     2 Minute Liters of Oxygen  0 L     3 Minute Oxygen Saturation %  92 %     3 Minute Liters of Oxygen  0 L     4 Minute Oxygen Saturation %  91 %     4 Minute Liters of Oxygen  0 L     5 Minute Oxygen Saturation %  90 %     5 Minute Liters of Oxygen  0 L     6 Minute Oxygen Saturation %  90 %     6 Minute Liters of Oxygen  0 L     2 Minute Post Oxygen Saturation %  97 %     2 Minute Post Liters of Oxygen  0 L       Oxygen Initial Assessment: Oxygen Initial Assessment - 06/14/19 1037      Home Oxygen   Home Oxygen Device  None    Sleep Oxygen Prescription  CPAP    Liters per minute  0    Home Exercise Oxygen Prescription  None    Home at Rest Exercise Oxygen Prescription  None    Compliance with Home Oxygen Use  Yes      Initial 6 min Walk   Oxygen Used  None      Program Oxygen Prescription   Program Oxygen Prescription  None      Intervention   Short Term Goals  To learn and exhibit compliance with exercise, home and travel O2 prescription;To learn and understand importance of monitoring SPO2 with pulse oximeter and demonstrate accurate use of the pulse oximeter.;To learn and understand importance of maintaining oxygen saturations>88%;To learn and demonstrate proper pursed lip  breathing techniques or other breathing techniques.;To learn and demonstrate proper use of respiratory medications    Long  Term Goals  Exhibits compliance with exercise, home and travel O2 prescription;Verbalizes importance of monitoring SPO2 with pulse oximeter and return demonstration;Maintenance of O2 saturations>88%;Exhibits proper breathing techniques, such as pursed lip breathing or other method taught during program session;Compliance with respiratory medication;Demonstrates proper use of MDI's       Oxygen Re-Evaluation: Oxygen Re-Evaluation    Row Name 07/10/19 0843             Program Oxygen Prescription   Program Oxygen Prescription  None         Home Oxygen   Home Oxygen Device  None       Sleep Oxygen Prescription  CPAP       Liters per minute  0       Home at Rest Exercise Oxygen Prescription  None       Compliance with Home Oxygen Use  Yes         Goals/Expected Outcomes   Short Term Goals  To learn and exhibit compliance with exercise, home and travel O2 prescription;To learn and understand importance of monitoring SPO2 with pulse oximeter and demonstrate accurate use of the pulse oximeter.;To learn and understand importance of maintaining oxygen saturations>88%;To learn and demonstrate proper pursed lip breathing techniques or other breathing techniques.;To learn and demonstrate proper use of respiratory medications       Long  Term Goals  Exhibits compliance with exercise, home and travel O2 prescription;Verbalizes importance of monitoring SPO2 with pulse oximeter and return demonstration;Maintenance of O2 saturations>88%;Exhibits proper breathing techniques, such as pursed lip breathing or other method taught during program session;Compliance with respiratory medication;Demonstrates proper use of MDI's       Comments  Dominica Severin uses his CPAP at night       Goals/Expected Outcomes  Short: continue to use CPAP as directed Long: manage oxygen          Oxygen Discharge  (Final Oxygen Re-Evaluation): Oxygen Re-Evaluation - 07/10/19 0843      Program Oxygen Prescription   Program Oxygen Prescription  None      Home Oxygen   Home Oxygen Device  None    Sleep Oxygen Prescription  CPAP    Liters per minute  0    Home at Rest Exercise Oxygen Prescription  None    Compliance with Home Oxygen Use  Yes      Goals/Expected Outcomes   Short Term Goals  To learn and exhibit compliance with exercise, home and travel O2 prescription;To learn and understand importance of monitoring SPO2 with pulse oximeter and demonstrate accurate use of the pulse oximeter.;To learn and understand importance of maintaining oxygen saturations>88%;To learn and demonstrate proper pursed lip breathing techniques or other breathing techniques.;To learn and demonstrate proper use of respiratory medications    Long  Term Goals  Exhibits compliance with exercise, home and travel O2 prescription;Verbalizes importance of monitoring SPO2 with pulse oximeter and return demonstration;Maintenance of O2 saturations>88%;Exhibits proper breathing techniques, such as pursed lip breathing or other method taught during program session;Compliance with respiratory medication;Demonstrates proper use of MDI's    Comments  Dominica Severin uses his CPAP at night    Goals/Expected Outcomes  Short: continue to use CPAP as directed Long: manage oxygen       Initial Exercise Prescription: Initial Exercise Prescription - 06/19/19 1100      Date of Initial Exercise RX and Referring Provider   Date  06/19/19    Referring Provider  Susa Simmonds MD      Treadmill   MPH  2.7    Grade  0.5    Minutes  15    METs  3.25      Elliptical   Level  1    Speed  2.7    Minutes  15    METs  3      REL-XR   Level  3    Speed  50    Minutes  15    METs  3      Prescription Details   Frequency (times per week)  2    Duration  Progress to 30 minutes of continuous aerobic without signs/symptoms of physical distress       Intensity   THRR 40-80% of Max Heartrate  96-129    Ratings of Perceived Exertion  11-13  Perceived Dyspnea  0-4      Progression   Progression  Continue to progress workloads to maintain intensity without signs/symptoms of physical distress.      Resistance Training   Training Prescription  Yes    Weight  4 lb    Reps  10-15       Perform Capillary Blood Glucose checks as needed.  Exercise Prescription Changes: Exercise Prescription Changes    Row Name 06/19/19 1100 07/03/19 1500           Response to Exercise   Blood Pressure (Admit)  108/64  124/80      Blood Pressure (Exercise)  134/54  140/80      Blood Pressure (Exit)  126/60  110/80      Heart Rate (Admit)  63 bpm  52 bpm      Heart Rate (Exercise)  103 bpm  88 bpm      Heart Rate (Exit)  64 bpm  63 bpm      Oxygen Saturation (Admit)  92 %  97 %      Oxygen Saturation (Exercise)  90 %  93 %      Oxygen Saturation (Exit)  96 %  97 %      Rating of Perceived Exertion (Exercise)  11  11      Perceived Dyspnea (Exercise)  2  3      Symptoms  SOB, fatigue at end  --      Comments  walk test results  --      Duration  --  Progress to 30 minutes of  aerobic without signs/symptoms of physical distress      Intensity  --  THRR unchanged        Progression   Progression  --  Continue to progress workloads to maintain intensity without signs/symptoms of physical distress.      Average METs  --  3        Resistance Training   Training Prescription  --  Yes      Weight  --   4 lb      Reps  --  10-15        Treadmill   MPH  --  2.8      Grade  --  0.5      Minutes  --  15      METs  --  3.25        REL-XR   Level  --  4      Speed  --  50      Minutes  --  15      METs  --  2.8         Exercise Comments: Exercise Comments    Row Name 06/21/19 0821           Exercise Comments  First full day of exercise!  Patient was oriented to gym and equipment including functions, settings, policies, and  procedures.  Patient's individual exercise prescription and treatment plan were reviewed.  All starting workloads were established based on the results of the 6 minute walk test done at initial orientation visit.  The plan for exercise progression was also introduced and progression will be customized based on patient's performance and goals.          Exercise Goals and Review: Exercise Goals    Row Name 06/19/19 1110             Exercise Goals  Increase Physical Activity  Yes       Intervention  Provide advice, education, support and counseling about physical activity/exercise needs.;Develop an individualized exercise prescription for aerobic and resistive training based on initial evaluation findings, risk stratification, comorbidities and participant's personal goals.       Expected Outcomes  Short Term: Attend rehab on a regular basis to increase amount of physical activity.;Long Term: Add in home exercise to make exercise part of routine and to increase amount of physical activity.;Long Term: Exercising regularly at least 3-5 days a week.       Increase Strength and Stamina  Yes       Intervention  Provide advice, education, support and counseling about physical activity/exercise needs.;Develop an individualized exercise prescription for aerobic and resistive training based on initial evaluation findings, risk stratification, comorbidities and participant's personal goals.       Expected Outcomes  Short Term: Increase workloads from initial exercise prescription for resistance, speed, and METs.;Short Term: Perform resistance training exercises routinely during rehab and add in resistance training at home;Long Term: Improve cardiorespiratory fitness, muscular endurance and strength as measured by increased METs and functional capacity (6MWT)       Able to understand and use rate of perceived exertion (RPE) scale  Yes       Intervention  Provide education and explanation on how to use RPE  scale       Expected Outcomes  Long Term:  Able to use RPE to guide intensity level when exercising independently;Short Term: Able to use RPE daily in rehab to express subjective intensity level       Able to understand and use Dyspnea scale  Yes       Intervention  Provide education and explanation on how to use Dyspnea scale       Expected Outcomes  Short Term: Able to use Dyspnea scale daily in rehab to express subjective sense of shortness of breath during exertion;Long Term: Able to use Dyspnea scale to guide intensity level when exercising independently       Knowledge and understanding of Target Heart Rate Range (THRR)  Yes       Intervention  Provide education and explanation of THRR including how the numbers were predicted and where they are located for reference       Expected Outcomes  Short Term: Able to use daily as guideline for intensity in rehab;Long Term: Able to use THRR to govern intensity when exercising independently;Short Term: Able to state/look up THRR       Able to check pulse independently  Yes       Intervention  Review the importance of being able to check your own pulse for safety during independent exercise;Provide education and demonstration on how to check pulse in carotid and radial arteries.       Expected Outcomes  Short Term: Able to explain why pulse checking is important during independent exercise;Long Term: Able to check pulse independently and accurately       Understanding of Exercise Prescription  Yes       Intervention  Provide education, explanation, and written materials on patient's individual exercise prescription       Expected Outcomes  Short Term: Able to explain program exercise prescription;Long Term: Able to explain home exercise prescription to exercise independently          Exercise Goals Re-Evaluation : Exercise Goals Re-Evaluation    Row Name 06/21/19 [phone] 07/03/19 1556 07/10/19 0840  Exercise Goal Re-Evaluation   Exercise  Goals Review  Able to understand and use rate of perceived exertion (RPE) scale;Knowledge and understanding of Target Heart Rate Range (THRR);Able to check pulse independently;Understanding of Exercise Prescription  Increase Physical Activity;Increase Strength and Stamina;Able to understand and use rate of perceived exertion (RPE) scale;Able to understand and use Dyspnea scale;Knowledge and understanding of Target Heart Rate Range (THRR);Able to check pulse independently;Understanding of Exercise Prescription  Increase Physical Activity;Increase Strength and Stamina;Able to understand and use rate of perceived exertion (RPE) scale;Able to understand and use Dyspnea scale;Knowledge and understanding of Target Heart Rate Range (THRR);Able to check pulse independently;Understanding of Exercise Prescription     Comments  Reviewed RPE scale, THR and program prescription with pt today.  Pt voiced understanding and was given a copy of goals to take home.  Gary tolerates exercise well.  Oxygen stayed above 88%.  Staff will monitor progress.  Dominica Severin lost 30+ lb during his Covid event.  he wants to be able to do yard work and regain strength and stamina.  He did 2 sets of 6 min on elliptical     Expected Outcomes  Short: Use RPE daily to regulate intensity. Long: Follow program prescription in THR.  Short: attend consistently Long: build overall stamina  Short:  be able to do elliptical for 15 min straight  Long: build overall stamina        Discharge Exercise Prescription (Final Exercise Prescription Changes): Exercise Prescription Changes - 07/03/19 1500      Response to Exercise   Blood Pressure (Admit)  124/80    Blood Pressure (Exercise)  140/80    Blood Pressure (Exit)  110/80    Heart Rate (Admit)  52 bpm    Heart Rate (Exercise)  88 bpm    Heart Rate (Exit)  63 bpm    Oxygen Saturation (Admit)  97 %    Oxygen Saturation (Exercise)  93 %    Oxygen Saturation (Exit)  97 %    Rating of Perceived Exertion  (Exercise)  11    Perceived Dyspnea (Exercise)  3    Duration  Progress to 30 minutes of  aerobic without signs/symptoms of physical distress    Intensity  THRR unchanged      Progression   Progression  Continue to progress workloads to maintain intensity without signs/symptoms of physical distress.    Average METs  3      Resistance Training   Training Prescription  Yes    Weight   4 lb    Reps  10-15      Treadmill   MPH  2.8    Grade  0.5    Minutes  15    METs  3.25      REL-XR   Level  4    Speed  50    Minutes  15    METs  2.8       Nutrition:  Target Goals: Understanding of nutrition guidelines, daily intake of sodium <1524m, cholesterol <2014m calories 30% from fat and 7% or less from saturated fats, daily to have 5 or more servings of fruits and vegetables.  Education: Controlling Sodium/Reading Food Labels -Group verbal and written material supporting the discussion of sodium use in heart healthy nutrition. Review and explanation with models, verbal and written materials for utilization of the food label.   Education: General Nutrition Guidelines/Fats and Fiber: -Group instruction provided by verbal, written material, models and posters to present the general guidelines  for heart healthy nutrition. Gives an explanation and review of dietary fats and fiber.   Biometrics: Pre Biometrics - 06/19/19 1112      Pre Biometrics   Height  6' 3.6" (1.92 m)    Weight  223 lb (101.2 kg)    BMI (Calculated)  27.44    Single Leg Stand  2.34 seconds        Nutrition Therapy Plan and Nutrition Goals: Nutrition Therapy & Goals - 07/04/19 1201      Nutrition Therapy   Diet  Low Na, HH    Protein (specify units)  85-90g    Whole Grain Foods  5 servings    Saturated Fats  12 max. grams    Fruits and Vegetables  5 servings/day    Sodium  1.5 grams      Personal Nutrition Goals   Nutrition Goal  ST: review paperwork and go over short term goals LT: Increase lung  capacity, increased flexibilty, regain muscle lost when in hospital    Comments  Pt reports there is not such thing as a normal day of eating. Cereal with fruit (3xdays) and yogurt, other days go out and get eggs with grits or hashbrowns, and sometimes will get a biscuit. L: if have a big breakfast may not have lunch - typically fast food. D: wife home cooking  or going out to eat on the days when they go see grandaughter play. Discussed general healthy eating, pulmonary MNT. Pt would like to review information.      Intervention Plan   Intervention  Prescribe, educate and counsel regarding individualized specific dietary modifications aiming towards targeted core components such as weight, hypertension, lipid management, diabetes, heart failure and other comorbidities.;Nutrition handout(s) given to patient.    Expected Outcomes  Short Term Goal: Understand basic principles of dietary content, such as calories, fat, sodium, cholesterol and nutrients.;Short Term Goal: A plan has been developed with personal nutrition goals set during dietitian appointment.;Long Term Goal: Adherence to prescribed nutrition plan.       Nutrition Assessments: Nutrition Assessments - 06/19/19 1111      MEDFICTS Scores   Pre Score  39       MEDIFICTS Score Key:          ?70 Need to make dietary changes          40-70 Heart Healthy Diet         ? 40 Therapeutic Level Cholesterol Diet  Nutrition Goals Re-Evaluation:   Nutrition Goals Discharge (Final Nutrition Goals Re-Evaluation):   Psychosocial: Target Goals: Acknowledge presence or absence of significant depression and/or stress, maximize coping skills, provide positive support system. Participant is able to verbalize types and ability to use techniques and skills needed for reducing stress and depression.   Education: Depression - Provides group verbal and written instruction on the correlation between heart/lung disease and depressed mood, treatment  options, and the stigmas associated with seeking treatment.   Education: Sleep Hygiene -Provides group verbal and written instruction about how sleep can affect your health.  Define sleep hygiene, discuss sleep cycles and impact of sleep habits. Review good sleep hygiene tips.    Education: Stress and Anxiety: - Provides group verbal and written instruction about the health risks of elevated stress and causes of high stress.  Discuss the correlation between heart/lung disease and anxiety and treatment options. Review healthy ways to manage with stress and anxiety.   Initial Review & Psychosocial Screening: Initial Psych Review & Screening -  06/14/19 1038      Initial Review   Current issues with  Current Sleep Concerns;Current Stress Concerns    Source of Stress Concerns  Chronic Illness    Comments  Sometimes he cannot sleep well. He wakes up with aches and pains at times.      Family Dynamics   Good Support System?  Yes    Comments  He can look to his wife, daughters and 4 grandchildren for support.      Barriers   Psychosocial barriers to participate in program  The patient should benefit from training in stress management and relaxation.      Screening Interventions   Interventions  Encouraged to exercise;To provide support and resources with identified psychosocial needs;Provide feedback about the scores to participant    Expected Outcomes  Short Term goal: Utilizing psychosocial counselor, staff and physician to assist with identification of specific Stressors or current issues interfering with healing process. Setting desired goal for each stressor or current issue identified.;Long Term Goal: Stressors or current issues are controlled or eliminated.;Short Term goal: Identification and review with participant of any Quality of Life or Depression concerns found by scoring the questionnaire.;Long Term goal: The participant improves quality of Life and PHQ9 Scores as seen by post  scores and/or verbalization of changes       Quality of Life Scores:  Scores of 19 and below usually indicate a poorer quality of life in these areas.  A difference of  2-3 points is a clinically meaningful difference.  A difference of 2-3 points in the total score of the Quality of Life Index has been associated with significant improvement in overall quality of life, self-image, physical symptoms, and general health in studies assessing change in quality of life.  PHQ-9: Recent Review Flowsheet Data    Depression screen [facility] 2/9 06/19/2019   Decreased Interest 0   Down, Depressed, Hopeless 0   PHQ - 2 Score 0   Altered sleeping 1   Tired, decreased energy 0   Change in appetite 1   Feeling bad or failure about yourself  0   Trouble concentrating 0   Moving slowly or fidgety/restless 0   Suicidal thoughts 0   PHQ-9 Score 2   Difficult doing work/chores Not difficult at all     Interpretation of Total Score  Total Score Depression Severity:  1-4 = Minimal depression, 5-9 = Mild depression, 10-14 = Moderate depression, 15-19 = Moderately severe depression, 20-27 = Severe depression   Psychosocial Evaluation and Intervention: Psychosocial Evaluation - 06/14/19 1042      Psychosocial Evaluation & Interventions   Interventions  Encouraged to exercise with the program and follow exercise prescription    Comments  patient has a positive outlook on his health and is read to start pulmonary rehab.    Expected Outcomes  Short: Attend [facility] stress management education to decrease stress. Long: Maintain exercise Post [facility] to keep stress at a minimum.    Continue Psychosocial Services   Follow up required by staff       Psychosocial Re-Evaluation: Psychosocial Re-Evaluation    Julian Name 07/10/19 [phone]             Psychosocial Re-Evaluation   Current issues with  Current Stress Concerns;Current Sleep Concerns       Comments  Dominica Severin wakes up a number of times during the night.   His sleep "could be better".  He does use his CPAP.  He doesnt have any particular  stress.       Expected Outcomes  Short: continue to use CPAP as directed Long: develop better sleep patterns          Psychosocial Discharge (Final Psychosocial Re-Evaluation): Psychosocial Re-Evaluation - 07/10/19 0838      Psychosocial Re-Evaluation   Current issues with  Current Stress Concerns;Current Sleep Concerns    Comments  Dominica Severin wakes up a number of times during the night.  His sleep "could be better".  He does use his CPAP.  He doesnt have any particular stress.    Expected Outcomes  Short: continue to use CPAP as directed Long: develop better sleep patterns       Education: Education Goals: Education classes will be provided on a weekly basis, covering required topics. Participant will state understanding/return demonstration of topics presented.  Learning Barriers/Preferences: Learning Barriers/Preferences - 06/14/19 1039      Learning Barriers/Preferences   Learning Barriers  None    Learning Preferences  None       General Pulmonary Education Topics:  Infection Prevention: - Provides verbal and written material to individual with discussion of infection control including proper hand washing and proper equipment cleaning during exercise session.   Pulmonary Rehab from 06/14/2019 in [facility] Cardiac and Pulmonary Rehab  Date  06/14/19  Educator  [facility]  Instruction Review Code  1- Verbalizes Understanding      Falls Prevention: - Provides verbal and written material to individual with discussion of falls prevention and safety.   Pulmonary Rehab from 06/14/2019 in [facility] Cardiac and Pulmonary Rehab  Date  06/14/19  Educator  [facility]  Instruction Review Code  1- Verbalizes Understanding      Chronic Lung Diseases: - Group verbal and written instruction to review updates, respiratory medications, advancements in procedures and treatments. Discuss use of supplemental oxygen including available  portable oxygen systems, continuous and intermittent flow rates, concentrators, personal use and safety guidelines. Review proper use of inhaler and spacers. Provide informative websites for self-education.    Energy Conservation: - Provide group verbal and written instruction for methods to conserve energy, plan and organize activities. Instruct on pacing techniques, use of adaptive equipment and posture/positioning to relieve shortness of breath.   Triggers and Exacerbations: - Group verbal and written instruction to review types of environmental triggers and ways to prevent exacerbations. Discuss weather changes, air quality and the benefits of nasal washing. Review warning signs and symptoms to help prevent infections. Discuss techniques for effective airway clearance, coughing, and vibrations.   AED/CPR: - Group verbal and written instruction with the use of models to demonstrate the basic use of the AED with the basic ABC's of resuscitation.   Anatomy and Physiology of the Lungs: - Group verbal and written instruction with the use of models to provide basic lung anatomy and physiology related to function, structure and complications of lung disease.   Anatomy & Physiology of the Heart: - Group verbal and written instruction and models provide basic cardiac anatomy and physiology, with the coronary electrical and arterial systems. Review of Valvular disease and Heart Failure   Cardiac Medications: - Group verbal and written instruction to review commonly prescribed medications for heart disease. Reviews the medication, class of the drug, and side effects.   Other: -Provides group and verbal instruction on various topics (see comments)   Knowledge Questionnaire Score: Knowledge Questionnaire Score - 06/19/19 1111      Knowledge Questionnaire Score   Pre Score  17/18 Education Focus: O2 safety  Core Components/Risk Factors/Patient Goals at Admission: Personal Goals  and Risk Factors at Admission - 06/19/19 1111      Core Components/Risk Factors/Patient Goals on Admission    Weight Management  Yes;Weight Loss    Intervention  Weight Management: Develop a combined nutrition and exercise program designed to reach desired caloric intake, while maintaining appropriate intake of nutrient and fiber, sodium and fats, and appropriate energy expenditure required for the weight goal.;Weight Management: Provide education and appropriate resources to help participant work on and attain dietary goals.    Admit Weight  223 lb (101.2 kg)    Goal Weight: Short Term  218 lb (98.9 kg)    Goal Weight: Long Term  208 lb (94.3 kg)    Expected Outcomes  Short Term: Continue to assess and modify interventions until short term weight is achieved;Long Term: Adherence to nutrition and physical activity/exercise program aimed toward attainment of established weight goal;Weight Loss: Understanding of general recommendations for a balanced deficit meal plan, which promotes 1-2 lb weight loss per week and includes a negative energy balance of [id number] kcal/d;Understanding recommendations for meals to include 15-35% energy as protein, 25-35% energy from fat, 35-60% energy from carbohydrates, less than 264m of dietary cholesterol, 20-35 gm of total fiber daily;Understanding of distribution of calorie intake throughout the day with the consumption of 4-5 meals/snacks    Improve shortness of breath with ADL's  Yes    Intervention  Provide education, individualized exercise plan and daily activity instruction to help decrease symptoms of SOB with activities of daily living.    Expected Outcomes  Short Term: Improve cardiorespiratory fitness to achieve a reduction of symptoms when performing ADLs;Long Term: Be able to perform more ADLs without symptoms or delay the onset of symptoms    Heart Failure  Yes    Intervention  Provide a combined exercise and nutrition program that is supplemented with  education, support and counseling about heart failure. Directed toward relieving symptoms such as shortness of breath, decreased exercise tolerance, and extremity edema.    Expected Outcomes  Improve functional capacity of life;Short term: Attendance in program 2-3 days a week with increased exercise capacity. Reported lower sodium intake. Reported increased fruit and vegetable intake. Reports medication compliance.;Short term: Daily weights obtained and reported for increase. Utilizing diuretic protocols set by physician.;Long term: Adoption of self-care skills and reduction of barriers for early signs and symptoms recognition and intervention leading to self-care maintenance.    Hypertension  Yes    Intervention  Provide education on lifestyle modifcations including regular physical activity/exercise, weight management, moderate sodium restriction and increased consumption of fresh fruit, vegetables, and low fat dairy, alcohol moderation, and smoking cessation.;Monitor prescription use compliance.    Expected Outcomes  Short Term: Continued assessment and intervention until BP is < 140/939mHG in hypertensive participants. < 130/8046mG in hypertensive participants with diabetes, heart failure or chronic kidney disease.;Long Term: Maintenance of blood pressure at goal levels.       Education:Diabetes - Individual verbal and written instruction to review signs/symptoms of diabetes, desired ranges of glucose level fasting, after meals and with exercise. Acknowledge that pre and post exercise glucose checks will be done for 3 sessions at entry of program.   Education: Know Your Numbers and Risk Factors: -Group verbal and written instruction about important numbers in your health.  Discussion of what are risk factors and how they play a role in the disease process.  Review of Cholesterol, Blood Pressure, Diabetes, and BMI and  the role they play in your overall health.   Core Components/Risk  Factors/Patient Goals Review:  Goals and Risk Factor Review    Row Name 07/10/19 0834             Core Components/Risk Factors/Patient Goals Review   Personal Goals Review  Weight Management/Obesity;Develop more efficient breathing techniques such as purse lipped breathing and diaphragmatic breathing and practicing self-pacing with activity.;Improve shortness of breath with ADL's       Review  Dominica Severin has noticed his walking is better - he can do stuff longer than he could before.  The elliptical makes him tired - we discussed the long term gains from exercise.  He lost 30+ lb during Covid and had a lung surgery.  He would like to be able to do yard work.  He takes meds as directed.       Expected Outcomes  Short:  continue to attend consistently Long:  manage risk factors and regain strength          Core Components/Risk Factors/Patient Goals at Discharge (Final Review):  Goals and Risk Factor Review - 07/10/19 0834      Core Components/Risk Factors/Patient Goals Review   Personal Goals Review  Weight Management/Obesity;Develop more efficient breathing techniques such as purse lipped breathing and diaphragmatic breathing and practicing self-pacing with activity.;Improve shortness of breath with ADL's    Review  Dominica Severin has noticed his walking is better - he can do stuff longer than he could before.  The elliptical makes him tired - we discussed the long term gains from exercise.  He lost 30+ lb during Covid and had a lung surgery.  He would like to be able to do yard work.  He takes meds as directed.    Expected Outcomes  Short:  continue to attend consistently Long:  manage risk factors and regain strength       ITP Comments: ITP Comments    Row Name 06/14/19 1045 06/19/19 1100 06/20/19 0603 06/21/19 0821 07/04/19 1217   ITP Comments  Virtual Orientation performed. Patient informed when to come in for RD and EP orientation. Diagnosis can be found in [facility] 06/01/2019.  Completed 6MWT and gym  orientation.  Initial ITP created and sent for review to Dr. Emily Filbert, Medical Director.  30 day chart review completed. ITP sent to Dr Zachery Dakins Medical Director, for review,changes as needed and signature. Continue with ITP if no changes requested New to program  First full day of exercise!  Patient was oriented to gym and equipment including functions, settings, policies, and procedures.  Patient's individual exercise prescription and treatment plan were reviewed.  All starting workloads were established based on the results of the 6 minute walk test done at initial orientation visit.  The plan for exercise progression was also introduced and progression will be customized based on patient's performance and goals.  Completed Initial RD Evaluation   Row Name 07/18/19 0531           ITP Comments  30 Day review completed. Medical Director review done, changes made as directed,and approval shown by signature of Market researcher.          Comments:

## 2019-07-19 ENCOUNTER — Encounter: Payer: Medicare Other | Admitting: *Deleted

## 2019-07-19 ENCOUNTER — Other Ambulatory Visit: Payer: Self-pay

## 2019-07-19 DIAGNOSIS — J869 Pyothorax without fistula: Secondary | ICD-10-CM

## 2019-07-19 DIAGNOSIS — J439 Emphysema, unspecified: Secondary | ICD-10-CM | POA: Diagnosis not present

## 2019-07-19 NOTE — Progress Notes (Signed)
Daily Session Note  Patient Details  Name: Ricky Petersen MRN: [id number] Date of Birth: 12-28-1945 Referring Provider:     Pulmonary Rehab from 06/19/2019 in [facility] Cardiac and Pulmonary Rehab  Referring Provider  Susa Simmonds MD      Encounter Date: 07/19/2019  Check In: Session Check In - 07/19/19 0819      Check-In   Supervising physician immediately available to respond to emergencies  See telemetry face sheet for immediately available ER MD    Location  [facility]-Cardiac & Pulmonary Rehab    Staff Present  Heath Lark, RN, BSN, CCRP;Melissa [place] RDN, Rowe Pavy, BA, ACSM CEP, Exercise Physiologist    Virtual Visit  No    Medication changes reported      No    Fall or balance concerns reported     No    Warm-up and Cool-down  Performed on first and last piece of equipment    Resistance Training Performed  Yes    VAD Patient?  No    PAD/SET Patient?  No      Pain Assessment   Currently in Pain?  No/denies          Social History   Tobacco Use  Smoking Status Former Smoker  . Packs/day: 1.00  . Years: 15.00  . Pack years: 15.00  . Types: Cigarettes  . Quit date: 46  . Years since quitting: 41.3  Smokeless Tobacco Never Used    Goals Met:  Proper associated with RPD/PD & O2 Sat Independence with exercise equipment Exercise tolerated well No report of cardiac concerns or symptoms  Goals Unmet:  Not Applicable  Comments: Pt able to follow exercise prescription today without complaint.  Will continue to monitor for progression.    Dr. Emily Filbert is Medical Director for [place] and [facility].

## 2019-07-24 ENCOUNTER — Encounter: Payer: Medicare Other | Admitting: *Deleted

## 2019-07-24 ENCOUNTER — Other Ambulatory Visit: Payer: Self-pay

## 2019-07-24 DIAGNOSIS — J439 Emphysema, unspecified: Secondary | ICD-10-CM | POA: Diagnosis not present

## 2019-07-24 DIAGNOSIS — J869 Pyothorax without fistula: Secondary | ICD-10-CM

## 2019-07-24 NOTE — Progress Notes (Signed)
Daily Session Note  Patient Details  Name: Ricky Petersen MRN: [id number] Date of Birth: 12-Jan-1946 Referring Provider:     Pulmonary Rehab from 06/19/2019 in [facility] Cardiac and Pulmonary Rehab  Referring Provider  Susa Simmonds MD      Encounter Date: 07/24/2019  Check In: Session Check In - 07/24/19 0803      Check-In   Supervising physician immediately available to respond to emergencies  See telemetry face sheet for immediately available ER MD    Location  [facility]-Cardiac & Pulmonary Rehab    Staff Present  Nada Maclachlan, BA, ACSM CEP, Exercise Physiologist;Joseph Flavia Shipper;Heath Lark, RN, BSN, CCRP    Virtual Visit  No    Medication changes reported      No    Fall or balance concerns reported     No    Warm-up and Cool-down  Performed on first and last piece of equipment    Resistance Training Performed  Yes    VAD Patient?  No    PAD/SET Patient?  No      Pain Assessment   Currently in Pain?  No/denies          Social History   Tobacco Use  Smoking Status Former Smoker  . Packs/day: 1.00  . Years: 15.00  . Pack years: 15.00  . Types: Cigarettes  . Quit date: 12  . Years since quitting: 41.3  Smokeless Tobacco Never Used    Goals Met:  Proper associated with RPD/PD & O2 Sat Independence with exercise equipment Exercise tolerated well No report of cardiac concerns or symptoms  Goals Unmet:  Not Applicable  Comments: Pt able to follow exercise prescription today without complaint.  Will continue to monitor for progression.    Dr. Emily Filbert is Medical Director for [place] and [facility].

## 2019-07-26 ENCOUNTER — Other Ambulatory Visit: Payer: Self-pay

## 2019-07-26 ENCOUNTER — Encounter: Payer: Medicare Other | Admitting: *Deleted

## 2019-07-26 DIAGNOSIS — J869 Pyothorax without fistula: Secondary | ICD-10-CM

## 2019-07-26 DIAGNOSIS — J439 Emphysema, unspecified: Secondary | ICD-10-CM | POA: Diagnosis not present

## 2019-07-26 NOTE — Progress Notes (Signed)
Daily Session Note  Patient Details  Name: Ricky Petersen MRN: [id number] Date of Birth: Sep 18, 1945 Referring Provider:     Pulmonary Rehab from 06/19/2019 in [facility] Cardiac and Pulmonary Rehab  Referring Provider  Susa Simmonds MD      Encounter Date: 07/26/2019  Check In: Session Check In - 07/26/19 0824      Check-In   Supervising physician immediately available to respond to emergencies  See telemetry face sheet for immediately available ER MD    Location  [facility]-Cardiac & Pulmonary Rehab    Staff Present  Carson Myrtle, BS, RRT, CPFT;Amanda Oletta Darter, BA, ACSM CEP, Exercise Physiologist;Rashied Corallo, RN, BSN, CCRP    Virtual Visit  No    Medication changes reported      No    Fall or balance concerns reported     No    Warm-up and Cool-down  Performed on first and last piece of equipment    Resistance Training Performed  Yes    VAD Patient?  No    PAD/SET Patient?  No      Pain Assessment   Currently in Pain?  No/denies          Social History   Tobacco Use  Smoking Status Former Smoker  . Packs/day: 1.00  . Years: 15.00  . Pack years: 15.00  . Types: Cigarettes  . Quit date: 38  . Years since quitting: 41.3  Smokeless Tobacco Never Used    Goals Met:  Proper associated with RPD/PD & O2 Sat Independence with exercise equipment Exercise tolerated well No report of cardiac concerns or symptoms  Goals Unmet:  Not Applicable  Comments: Pt able to follow exercise prescription today without complaint.  Will continue to monitor for progression.    Dr. Emily Filbert is Medical Director for [place] and [facility].

## 2019-07-31 ENCOUNTER — Encounter: Payer: Medicare Other | Attending: Pulmonary Disease | Admitting: *Deleted

## 2019-07-31 ENCOUNTER — Other Ambulatory Visit: Payer: Self-pay

## 2019-07-31 DIAGNOSIS — Z79899 Other long term (current) drug therapy: Secondary | ICD-10-CM | POA: Diagnosis not present

## 2019-07-31 DIAGNOSIS — Z7982 Long term (current) use of aspirin: Secondary | ICD-10-CM | POA: Insufficient documentation

## 2019-07-31 DIAGNOSIS — K219 Gastro-esophageal reflux disease without esophagitis: Secondary | ICD-10-CM | POA: Diagnosis not present

## 2019-07-31 DIAGNOSIS — Z7901 Long term (current) use of anticoagulants: Secondary | ICD-10-CM | POA: Insufficient documentation

## 2019-07-31 DIAGNOSIS — Z87891 Personal history of nicotine dependence: Secondary | ICD-10-CM | POA: Insufficient documentation

## 2019-07-31 DIAGNOSIS — J869 Pyothorax without fistula: Secondary | ICD-10-CM

## 2019-07-31 DIAGNOSIS — I110 Hypertensive heart disease with heart failure: Secondary | ICD-10-CM | POA: Diagnosis not present

## 2019-07-31 DIAGNOSIS — J439 Emphysema, unspecified: Secondary | ICD-10-CM | POA: Insufficient documentation

## 2019-07-31 NOTE — Progress Notes (Signed)
Daily Session Note  Patient Details  Name: Ricky Petersen MRN: [id number] Date of Birth: 1946/03/04 Referring Provider:     Pulmonary Rehab from 06/19/2019 in [facility] Cardiac and Pulmonary Rehab  Referring Provider  Susa Simmonds MD      Encounter Date: 07/31/2019  Check In: Session Check In - 07/31/19 0816      Check-In   Supervising physician immediately available to respond to emergencies  See telemetry face sheet for immediately available ER MD    Location  [facility]-Cardiac & Pulmonary Rehab    Staff Present  Heath Lark, RN, BSN, CCRP;Joseph Foy [place], [place], ACSM CEP, Exercise Physiologist    Virtual Visit  No    Medication changes reported      No    Fall or balance concerns reported     No    Warm-up and Cool-down  Performed on first and last piece of equipment    Resistance Training Performed  Yes    VAD Patient?  No    PAD/SET Patient?  No      Pain Assessment   Currently in Pain?  No/denies          Social History   Tobacco Use  Smoking Status Former Smoker  . Packs/day: 1.00  . Years: 15.00  . Pack years: 15.00  . Types: Cigarettes  . Quit date: 39  . Years since quitting: 41.3  Smokeless Tobacco Never Used    Goals Met:  Proper associated with RPD/PD & O2 Sat Independence with exercise equipment Exercise tolerated well No report of cardiac concerns or symptoms  Goals Unmet:  Not Applicable  Comments: Pt able to follow exercise prescription today without complaint.  Will continue to monitor for progression.    Dr. Emily Filbert is Medical Director for [place] and [facility].

## 2019-08-07 ENCOUNTER — Other Ambulatory Visit: Payer: Self-pay

## 2019-08-07 ENCOUNTER — Encounter: Payer: Medicare Other | Admitting: *Deleted

## 2019-08-07 DIAGNOSIS — J439 Emphysema, unspecified: Secondary | ICD-10-CM | POA: Diagnosis not present

## 2019-08-07 DIAGNOSIS — J869 Pyothorax without fistula: Secondary | ICD-10-CM

## 2019-08-07 NOTE — Progress Notes (Signed)
Daily Session Note  Patient Details  Name: Ricky Petersen MRN: [id number] Date of Birth: Apr 18, 1945 Referring Provider:     Pulmonary Rehab from 06/19/2019 in [facility] Cardiac and Pulmonary Rehab  Referring Provider  Susa Simmonds MD      Encounter Date: 08/07/2019  Check In: Session Check In - 08/07/19 0817      Check-In   Supervising physician immediately available to respond to emergencies  See telemetry face sheet for immediately available ER MD    Location  [facility]-Cardiac & Pulmonary Rehab    Staff Present  Heath Lark, RN, BSN, CCRP;Amanda Sommer, BA, ACSM CEP, Exercise Physiologist;Joseph Hood RCP,RRT,BSRT    Virtual Visit  No    Medication changes reported      No    Fall or balance concerns reported     No    Warm-up and Cool-down  Performed on first and last piece of equipment    Resistance Training Performed  Yes    VAD Patient?  No    PAD/SET Patient?  No      Pain Assessment   Currently in Pain?  No/denies          Social History   Tobacco Use  Smoking Status Former Smoker  . Packs/day: 1.00  . Years: 15.00  . Pack years: 15.00  . Types: Cigarettes  . Quit date: 72  . Years since quitting: 41.3  Smokeless Tobacco Never Used    Goals Met:  Independence with exercise equipment Exercise tolerated well No report of cardiac concerns or symptoms  Goals Unmet:  Not Applicable  Comments: Pt able to follow exercise prescription today without complaint.  Will continue to monitor for progression.    Dr. Emily Filbert is Medical Director for [place] and [facility].

## 2019-08-09 ENCOUNTER — Encounter: Payer: Medicare Other | Admitting: *Deleted

## 2019-08-09 ENCOUNTER — Other Ambulatory Visit: Payer: Self-pay

## 2019-08-09 DIAGNOSIS — J439 Emphysema, unspecified: Secondary | ICD-10-CM | POA: Diagnosis not present

## 2019-08-09 DIAGNOSIS — J869 Pyothorax without fistula: Secondary | ICD-10-CM

## 2019-08-09 NOTE — Progress Notes (Signed)
Daily Session Note  Patient Details  Name: Ricky Petersen MRN: [id number] Date of Birth: [birth year] Referring Provider:     Pulmonary Rehab from 06/19/2019 in [facility] Cardiac and Pulmonary Rehab  Referring Provider  Susa Simmonds MD      Encounter Date: 08/09/2019  Check In: Session Check In - 08/09/19 0821      Check-In   Supervising physician immediately available to respond to emergencies  See telemetry face sheet for immediately available ER MD    Staff Present  Heath Lark, RN, BSN, CCRP;Amanda Sommer, BA, ACSM CEP, Exercise Physiologist;Melissa Caiola RDN, LDN    Virtual Visit  No    Medication changes reported      No    Fall or balance concerns reported     No    Warm-up and Cool-down  Performed on first and last piece of equipment    Resistance Training Performed  Yes    VAD Patient?  No    PAD/SET Patient?  No      Pain Assessment   Currently in Pain?  No/denies          Social History   Tobacco Use  Smoking Status Former Smoker  . Packs/day: 1.00  . Years: 15.00  . Pack years: 15.00  . Types: Cigarettes  . Quit date: 70  . Years since quitting: 41.3  Smokeless Tobacco Never Used    Goals Met:  Proper associated with RPD/PD & O2 Sat Independence with exercise equipment Exercise tolerated well No report of cardiac concerns or symptoms  Goals Unmet:  Not Applicable  Comments: Pt able to follow exercise prescription today without complaint.  Will continue to monitor for progression.    Dr. Emily Filbert is Medical Director for [place] and [facility].

## 2019-08-14 ENCOUNTER — Other Ambulatory Visit: Payer: Self-pay

## 2019-08-14 ENCOUNTER — Encounter: Payer: Medicare Other | Admitting: *Deleted

## 2019-08-14 DIAGNOSIS — J869 Pyothorax without fistula: Secondary | ICD-10-CM

## 2019-08-14 DIAGNOSIS — J439 Emphysema, unspecified: Secondary | ICD-10-CM | POA: Diagnosis not present

## 2019-08-14 NOTE — Progress Notes (Signed)
Daily Session Note  Patient Details  Name: Ricky Petersen MRN: [id number] Date of Birth: 1945-08-12 Referring Provider:     Pulmonary Rehab from 06/19/2019 in [facility]  Referring Provider  Susa Simmonds MD      Encounter Date: 08/14/2019  Check In: Session Check In - 08/14/19 1110      Check-In   Supervising physician immediately available to respond to emergencies  See telemetry face sheet for immediately available ER MD    Location  [facility]    Staff Present  Heath Lark, RN, BSN, CCRP;Amanda Sommer, BA, ACSM CEP, Exercise Physiologist;Joseph Hood RCP,RRT,BSRT    Virtual Visit  No    Medication changes reported      No    Fall or balance concerns reported     No    Warm-up and Cool-down  Performed on first and last piece of equipment    Resistance Training Performed  Yes    VAD Patient?  No    PAD/SET Patient?  No      Pain Assessment   Currently in Pain?  No/denies          Social History   Tobacco Use  Smoking Status Former Smoker  . Packs/day: 1.00  . Years: 15.00  . Pack years: 15.00  . Types: Cigarettes  . Quit date: 56  . Years since quitting: 41.4  Smokeless Tobacco Never Used    Goals Met:  Proper associated with RPD/PD & O2 Sat Independence with exercise equipment Exercise tolerated well No report of cardiac concerns or symptoms  Goals Unmet:  Not Applicable  Comments: Pt able to follow exercise prescription today without complaint.  Will continue to monitor for progression.    Dr. Emily Filbert is Medical Director for [place] and [facility].

## 2019-08-15 ENCOUNTER — Encounter: Payer: Self-pay | Admitting: *Deleted

## 2019-08-15 DIAGNOSIS — J869 Pyothorax without fistula: Secondary | ICD-10-CM

## 2019-08-15 NOTE — Progress Notes (Signed)
Pulmonary Individual Treatment Plan  Patient Details  Name: CHANNON AMBROSINI MRN: [id number] Date of Birth: 01-13-1946 Referring Provider:     Pulmonary Rehab from 06/19/2019 in [facility] Rehab  Referring Provider  Susa Simmonds MD      Initial Encounter Date:    Pulmonary Rehab from 06/19/2019 in [facility] Cardiac and Pulmonary Rehab  Date  06/19/19      Visit Diagnosis: Empyema ([place])  Patient's Home Medications on Admission:  Current Outpatient Medications:  .  aspirin EC 81 MG tablet, Take 81 mg by mouth daily., Disp: , Rfl:  .  Eliquis DVT/PE Starter Pack (ELIQUIS STARTER PACK) 5 MG TABS, Take as directed on package: start with two-79m tablets twice daily for 7 days. On day 8, switch to one-549mtablet twice daily., Disp: 1 each, Rfl: 0 .  fluticasone (FLONASE) 50 MCG/ACT nasal spray, Place 2 sprays into both nostrils daily., Disp: , Rfl:  .  HYDROcodone-acetaminophen (NORCO/VICODIN) 5-325 MG tablet, Take 1 tablet by mouth 3 (three) times daily as needed for severe pain. (Patient not taking: Reported on 06/14/2019), Disp: 15 tablet, Rfl: 0 .  losartan (COZAAR) 100 MG tablet, Take 150 mg by mouth daily. , Disp: , Rfl:  .  metoprolol succinate (TOPROL-XL) 50 MG 24 hr tablet, Take 50 mg by mouth daily., Disp: , Rfl:  .  omeprazole (PRILOSEC) 20 MG capsule, Take 20 mg by mouth 2 (two) times daily before a meal., Disp: , Rfl:   Past Medical History: Past Medical History:  Diagnosis Date  . COVID-19   . GERD (gastroesophageal reflux disease)   . Hypertension   . Idiopathic cardiomyopathy ([place]  . Sleep apnea     Tobacco Use: Social History   Tobacco Use  Smoking Status Former Smoker  . Packs/day: 1.00  . Years: 15.00  . Pack years: 15.00  . Types: Cigarettes  . Quit date: 1971. Years since quitting: 41.4  Smokeless Tobacco Never Used    Labs: Recent Review Flowsheet Data    Labs for ITP Cardiac and Pulmonary Rehab 10/26/2006   HCO3 26.4(H)   TCO2 28        Pulmonary Assessment Scores: Pulmonary Assessment Scores    Row Name 06/19/19 1113         ADL UCSD   ADL Phase  Entry     SOB Score total  26     Rest  0     Walk  2     Stairs  3     Bath  0     Dress  0     Shop  2       CAT Score   CAT Score  14       mMRC Score   mMRC Score  2        UCSD: Self-administered rating of dyspnea associated with activities of daily living (ADLs) 6-point scale (0 = "not at all" to 5 = "maximal or unable to do because of breathlessness")  Scoring Scores range from 0 to 120.  Minimally important difference is 5 units  CAT: CAT can identify the health impairment of COPD patients and is better correlated with disease progression.  CAT has a scoring range of zero to 40. The CAT score is classified into four groups of low (less than 10), medium (10 - 20), high (21-30) and very high (31-40) based on the impact level of disease on health status. A CAT score over 10  suggests significant symptoms.  A worsening CAT score could be explained by an exacerbation, poor medication adherence, poor inhaler technique, or progression of COPD or comorbid conditions.  CAT MCID is 2 points  mMRC: mMRC (Modified Medical Research Council) Dyspnea Scale is used to assess the degree of baseline functional disability in patients of respiratory disease due to dyspnea. No minimal important difference is established. A decrease in score of 1 point or greater is considered a positive change.   Pulmonary Function Assessment: Pulmonary Function Assessment - 06/19/19 1113      Breath   Shortness of Breath  Yes;Fear of Shortness of Breath;Limiting activity       Exercise Target Goals: Exercise Program Goal: Individual exercise prescription set using results from initial 6 min walk test and THRR while considering  patient's activity barriers and safety.   Exercise Prescription Goal: Initial exercise prescription builds to 30-45 minutes a day of aerobic activity,  2-3 days per week.  Home exercise guidelines will be given to patient during program as part of exercise prescription that the participant will acknowledge.  Education: Aerobic Exercise & Resistance Training: - Gives group verbal and written instruction on the various components of exercise. Focuses on aerobic and resistive training programs and the benefits of this training and how to safely progress through these programs..   Education: Exercise & Equipment Safety: - Individual verbal instruction and demonstration of equipment use and safety with use of the equipment.   Pulmonary Rehab from 06/14/2019 in [facility] Cardiac and Pulmonary Rehab  Date  06/14/19  Educator  [facility]  Instruction Review Code  1- Verbalizes Understanding      Education: Exercise Physiology & General Exercise Guidelines: - Group verbal and written instruction with models to review the exercise physiology of the cardiovascular system and associated critical values. Provides general exercise guidelines with specific guidelines to those with heart or lung disease.    Education: Flexibility, Balance, Mind/Body Relaxation: Provides group verbal/written instruction on the benefits of flexibility and balance training, including mind/body exercise modes such as yoga, pilates and tai chi.  Demonstration and skill practice provided.   Activity Barriers & Risk Stratification: Activity Barriers & Cardiac Risk Stratification - 06/19/19 1108      Activity Barriers & Cardiac Risk Stratification   Activity Barriers  Balance Concerns;Deconditioning;Muscular Weakness;Other (comment);Joint Problems    Comments  numbness in both feet, L shoulder limited ROM from lung surgery       6 Minute Walk: 6 Minute Walk    Row Name 06/19/19 1100         6 Minute Walk   Phase  Initial     Distance  1435 feet     Walk Time  6 minutes     # of Rest Breaks  0     MPH  2.72     METS  3.1     RPE  11     Perceived Dyspnea   2     VO2 Peak   10.84     Symptoms  Yes (comment)     Comments  SOB, fatigue at end     Resting HR  63 bpm     Resting BP  108/64     Resting Oxygen Saturation   92 %     Exercise Oxygen Saturation  during 6 min walk  90 %     Max Ex. HR  103 bpm     Max Ex. BP  134/54  2 Minute Post BP  126/54       Interval HR   1 Minute HR  93     2 Minute HR  102     3 Minute HR  103     4 Minute HR  102     5 Minute HR  101     6 Minute HR  99     2 Minute Post HR  65     Interval Heart Rate?  Yes       Interval Oxygen   Interval Oxygen?  Yes     Baseline Oxygen Saturation %  92 %     1 Minute Oxygen Saturation %  95 %     1 Minute Liters of Oxygen  0 L Room Air     2 Minute Oxygen Saturation %  94 %     2 Minute Liters of Oxygen  0 L     3 Minute Oxygen Saturation %  92 %     3 Minute Liters of Oxygen  0 L     4 Minute Oxygen Saturation %  91 %     4 Minute Liters of Oxygen  0 L     5 Minute Oxygen Saturation %  90 %     5 Minute Liters of Oxygen  0 L     6 Minute Oxygen Saturation %  90 %     6 Minute Liters of Oxygen  0 L     2 Minute Post Oxygen Saturation %  97 %     2 Minute Post Liters of Oxygen  0 L       Oxygen Initial Assessment: Oxygen Initial Assessment - 06/14/19 1037      Home Oxygen   Home Oxygen Device  None    Sleep Oxygen Prescription  CPAP    Liters per minute  0    Home Exercise Oxygen Prescription  None    Home at Rest Exercise Oxygen Prescription  None    Compliance with Home Oxygen Use  Yes      Initial 6 min Walk   Oxygen Used  None      Program Oxygen Prescription   Program Oxygen Prescription  None      Intervention   Short Term Goals  To learn and exhibit compliance with exercise, home and travel O2 prescription;To learn and understand importance of monitoring SPO2 with pulse oximeter and demonstrate accurate use of the pulse oximeter.;To learn and understand importance of maintaining oxygen saturations>88%;To learn and demonstrate proper pursed lip  breathing techniques or other breathing techniques.;To learn and demonstrate proper use of respiratory medications    Long  Term Goals  Exhibits compliance with exercise, home and travel O2 prescription;Verbalizes importance of monitoring SPO2 with pulse oximeter and return demonstration;Maintenance of O2 saturations>88%;Exhibits proper breathing techniques, such as pursed lip breathing or other method taught during program session;Compliance with respiratory medication;Demonstrates proper use of MDI's       Oxygen Re-Evaluation: Oxygen Re-Evaluation    Row Name 07/10/19 0843 08/07/19 0811           Program Oxygen Prescription   Program Oxygen Prescription  None  None        Home Oxygen   Home Oxygen Device  None  None      Sleep Oxygen Prescription  CPAP  CPAP      Liters per minute  0  0      Home Exercise  Oxygen Prescription  --  None      Home at Rest Exercise Oxygen Prescription  None  None      Compliance with Home Oxygen Use  Yes  Yes        Goals/Expected Outcomes   Short Term Goals  To learn and exhibit compliance with exercise, home and travel O2 prescription;To learn and understand importance of monitoring SPO2 with pulse oximeter and demonstrate accurate use of the pulse oximeter.;To learn and understand importance of maintaining oxygen saturations>88%;To learn and demonstrate proper pursed lip breathing techniques or other breathing techniques.;To learn and demonstrate proper use of respiratory medications  To learn and exhibit compliance with exercise, home and travel O2 prescription;To learn and understand importance of monitoring SPO2 with pulse oximeter and demonstrate accurate use of the pulse oximeter.;To learn and understand importance of maintaining oxygen saturations>88%;To learn and demonstrate proper pursed lip breathing techniques or other breathing techniques.;To learn and demonstrate proper use of respiratory medications      Long  Term Goals  Exhibits  compliance with exercise, home and travel O2 prescription;Verbalizes importance of monitoring SPO2 with pulse oximeter and return demonstration;Maintenance of O2 saturations>88%;Exhibits proper breathing techniques, such as pursed lip breathing or other method taught during program session;Compliance with respiratory medication;Demonstrates proper use of MDI's  Exhibits compliance with exercise, home and travel O2 prescription;Verbalizes importance of monitoring SPO2 with pulse oximeter and return demonstration;Maintenance of O2 saturations>88%;Exhibits proper breathing techniques, such as pursed lip breathing or other method taught during program session;Compliance with respiratory medication;Demonstrates proper use of MDI's      Comments  Dominica Severin uses his CPAP at night  Dominica Severin is doing well at home.  He is using his CPAP and pulse oximeter at home.  He has noticed that he is no longer dropping low at home.  He is not consistent with his PLB but does not that it helps when he does use it.  He doesn't really get SOB, just fatigued.      Goals/Expected Outcomes  Short: continue to use CPAP as directed Long: manage oxygen  Short: Continue to work on PLB  Long: Continue to monitor oxygen levels.         Oxygen Discharge (Final Oxygen Re-Evaluation): Oxygen Re-Evaluation - 08/07/19 0811      Program Oxygen Prescription   Program Oxygen Prescription  None      Home Oxygen   Home Oxygen Device  None    Sleep Oxygen Prescription  CPAP    Liters per minute  0    Home Exercise Oxygen Prescription  None    Home at Rest Exercise Oxygen Prescription  None    Compliance with Home Oxygen Use  Yes      Goals/Expected Outcomes   Short Term Goals  To learn and exhibit compliance with exercise, home and travel O2 prescription;To learn and understand importance of monitoring SPO2 with pulse oximeter and demonstrate accurate use of the pulse oximeter.;To learn and understand importance of maintaining oxygen  saturations>88%;To learn and demonstrate proper pursed lip breathing techniques or other breathing techniques.;To learn and demonstrate proper use of respiratory medications    Long  Term Goals  Exhibits compliance with exercise, home and travel O2 prescription;Verbalizes importance of monitoring SPO2 with pulse oximeter and return demonstration;Maintenance of O2 saturations>88%;Exhibits proper breathing techniques, such as pursed lip breathing or other method taught during program session;Compliance with respiratory medication;Demonstrates proper use of MDI's    Comments  Dominica Severin is doing well at home.  He  is using his CPAP and pulse oximeter at home.  He has noticed that he is no longer dropping low at home.  He is not consistent with his PLB but does not that it helps when he does use it.  He doesn't really get SOB, just fatigued.    Goals/Expected Outcomes  Short: Continue to work on PLB  Long: Continue to monitor oxygen levels.       Initial Exercise Prescription: Initial Exercise Prescription - 06/19/19 1100      Date of Initial Exercise RX and Referring Provider   Date  06/19/19    Referring Provider  Susa Simmonds MD      Treadmill   MPH  2.7    Grade  0.5    Minutes  15    METs  3.25      Elliptical   Level  1    Speed  2.7    Minutes  15    METs  3      REL-XR   Level  3    Speed  50    Minutes  15    METs  3      Prescription Details   Frequency (times per week)  2    Duration  Progress to 30 minutes of continuous aerobic without signs/symptoms of physical distress      Intensity   THRR 40-80% of Max Heartrate  96-129    Ratings of Perceived Exertion  11-13    Perceived Dyspnea  0-4      Progression   Progression  Continue to progress workloads to maintain intensity without signs/symptoms of physical distress.      Resistance Training   Training Prescription  Yes    Weight  4 lb    Reps  10-15       Perform Capillary Blood Glucose checks as  needed.  Exercise Prescription Changes: Exercise Prescription Changes    Row Name 06/19/19 1100 07/03/19 1500 07/18/19 1100 07/31/19 1500 08/13/19 1500     Response to Exercise   Blood Pressure (Admit)  108/64  124/80  110/64  100/62  128/76   Blood Pressure (Exercise)  134/54  140/80  158/76  140/70  130/88   Blood Pressure (Exit)  126/60  110/80  96/60  120/70  122/56   Heart Rate (Admit)  63 bpm  52 bpm  57 bpm  56 bpm  50 bpm   Heart Rate (Exercise)  103 bpm  88 bpm  93 bpm  97 bpm  79 bpm   Heart Rate (Exit)  64 bpm  63 bpm  69 bpm  75 bpm  61 bpm   Oxygen Saturation (Admit)  92 %  97 %  99 %  98 %  96 %   Oxygen Saturation (Exercise)  90 %  93 %  97 %  95 %  96 %   Oxygen Saturation (Exit)  96 %  97 %  97 %  96 %  98 %   Rating of Perceived Exertion (Exercise)  '11  11  13  12  12   ' Perceived Dyspnea (Exercise)  '2  3  1  2  2   ' Symptoms  SOB, fatigue at end  --  none  none  knee pain on elliptical   Comments  walk test results  --  --  --  --   Duration  --  Progress to 30 minutes of  aerobic without signs/symptoms of physical distress  Continue with 30 min of aerobic exercise without signs/symptoms of physical distress.  Continue with 30 min of aerobic exercise without signs/symptoms of physical distress.  Continue with 30 min of aerobic exercise without signs/symptoms of physical distress.   Intensity  --  THRR unchanged  THRR unchanged  THRR unchanged  THRR unchanged     Progression   Progression  --  Continue to progress workloads to maintain intensity without signs/symptoms of physical distress.  Continue to progress workloads to maintain intensity without signs/symptoms of physical distress.  Continue to progress workloads to maintain intensity without signs/symptoms of physical distress.  Continue to progress workloads to maintain intensity without signs/symptoms of physical distress.   Average METs  --  3  4.17  4.67  3.72     Resistance Training   Training Prescription  --   Yes  Yes  Yes  Yes   Weight  --   4 lb   4 lb  4 lb  4 lb   Reps  --  10-15  10-15  10-15  10-15     Interval Training   Interval Training  --  --  No  No  No     Treadmill   MPH  --  2.8  2.8  2.7  2.8   Grade  --  0.5  1  1.5  1.5   Minutes  --  '15  15  15  15   ' METs  --  3.25  3.53  3.63  3.72     Recumbant Bike   Level  --  --  --  --  5   Minutes  --  --  --  --  15     NuStep   Level  --  --  --  --  4   Minutes  --  --  --  --  15     Elliptical   Level  --  --  2  --  2   Speed  --  --  2.7  --  2.7   Minutes  --  --  15  --  10     REL-XR   Level  --  '4  6  7  ' --   Speed  --  50  --  50  --   Minutes  --  '15  15  15  ' --   METs  --  2.8  4.8  5.7  --   Row Name 08/14/19 [place] to continue exercise at  [employer] (comment) [facility]       Frequency  Add 1 additional day to program exercise sessions.       Initial Home Exercises Provided  08/14/19          Exercise Comments: Exercise Comments    Row Name 06/21/19 4383           Exercise Comments  First full day of exercise!  Patient was oriented to gym and equipment including functions, settings, policies, and procedures.  Patient's individual exercise prescription and treatment plan were reviewed.  All starting workloads were established based on the results of the 6 minute walk test done at initial orientation visit.  The plan for exercise progression was also introduced and progression will be customized based on patient's performance and goals.          Exercise  Goals and Review: Exercise Goals    Row Name 06/19/19 1110             Exercise Goals   Increase Physical Activity  Yes       Intervention  Provide advice, education, support and counseling about physical activity/exercise needs.;Develop an individualized exercise prescription for aerobic and resistive training based on initial evaluation findings, risk stratification, comorbidities and  participant's personal goals.       Expected Outcomes  Short Term: Attend rehab on a regular basis to increase amount of physical activity.;Long Term: Add in home exercise to make exercise part of routine and to increase amount of physical activity.;Long Term: Exercising regularly at least 3-5 days a week.       Increase Strength and Stamina  Yes       Intervention  Provide advice, education, support and counseling about physical activity/exercise needs.;Develop an individualized exercise prescription for aerobic and resistive training based on initial evaluation findings, risk stratification, comorbidities and participant's personal goals.       Expected Outcomes  Short Term: Increase workloads from initial exercise prescription for resistance, speed, and METs.;Short Term: Perform resistance training exercises routinely during rehab and add in resistance training at home;Long Term: Improve cardiorespiratory fitness, muscular endurance and strength as measured by increased METs and functional capacity (6MWT)       Able to understand and use rate of perceived exertion (RPE) scale  Yes       Intervention  Provide education and explanation on how to use RPE scale       Expected Outcomes  Long Term:  Able to use RPE to guide intensity level when exercising independently;Short Term: Able to use RPE daily in rehab to express subjective intensity level       Able to understand and use Dyspnea scale  Yes       Intervention  Provide education and explanation on how to use Dyspnea scale       Expected Outcomes  Short Term: Able to use Dyspnea scale daily in rehab to express subjective sense of shortness of breath during exertion;Long Term: Able to use Dyspnea scale to guide intensity level when exercising independently       Knowledge and understanding of Target Heart Rate Range (THRR)  Yes       Intervention  Provide education and explanation of THRR including how the numbers were predicted and where they are  located for reference       Expected Outcomes  Short Term: Able to use daily as guideline for intensity in rehab;Long Term: Able to use THRR to govern intensity when exercising independently;Short Term: Able to state/look up THRR       Able to check pulse independently  Yes       Intervention  Review the importance of being able to check your own pulse for safety during independent exercise;Provide education and demonstration on how to check pulse in carotid and radial arteries.       Expected Outcomes  Short Term: Able to explain why pulse checking is important during independent exercise;Long Term: Able to check pulse independently and accurately       Understanding of Exercise Prescription  Yes       Intervention  Provide education, explanation, and written materials on patient's individual exercise prescription       Expected Outcomes  Short Term: Able to explain program exercise prescription;Long Term: Able to explain home exercise prescription to exercise independently  Exercise Goals Re-Evaluation : Exercise Goals Re-Evaluation    Row Name 06/21/19 6333 07/03/19 1556 07/10/19 0840 07/18/19 1134 07/31/19 1544     Exercise Goal Re-Evaluation   Exercise Goals Review  Able to understand and use rate of perceived exertion (RPE) scale;Knowledge and understanding of Target Heart Rate Range (THRR);Able to check pulse independently;Understanding of Exercise Prescription  Increase Physical Activity;Increase Strength and Stamina;Able to understand and use rate of perceived exertion (RPE) scale;Able to understand and use Dyspnea scale;Knowledge and understanding of Target Heart Rate Range (THRR);Able to check pulse independently;Understanding of Exercise Prescription  Increase Physical Activity;Increase Strength and Stamina;Able to understand and use rate of perceived exertion (RPE) scale;Able to understand and use Dyspnea scale;Knowledge and understanding of Target Heart Rate Range (THRR);Able to  check pulse independently;Understanding of Exercise Prescription  Increase Physical Activity;Increase Strength and Stamina;Understanding of Exercise Prescription  Increase Physical Activity;Increase Strength and Stamina;Able to understand and use rate of perceived exertion (RPE) scale;Able to understand and use Dyspnea scale;Knowledge and understanding of Target Heart Rate Range (THRR);Able to check pulse independently;Understanding of Exercise Prescription   Comments  Reviewed RPE scale, THR and program prescription with pt today.  Pt voiced understanding and was given a copy of goals to take home.  Gary tolerates exercise well.  Oxygen stayed above 88%.  Staff will monitor progress.  Dominica Severin lost 30+ lb during his Covid event.  he wants to be able to do yard work and regain strength and stamina.  He did 2 sets of 6 min on elliptical  Dominica Severin is doing well in rehab.  He is now up to level 6 on the XR and 2.8 mph on the treadmill.  We will continue to monitor his progress.  Dominica Severin attends consistently and his O2 sats have stayed in the high 90s.   Expected Outcomes  Short: Use RPE daily to regulate intensity. Long: Follow program prescription in THR.  Short: attend consistently Long: build overall stamina  Short:  be able to do elliptical for 15 min straight  Long: build overall stamina  Short: Continue to add to workloads  Long: Continue to improve stamina.  Short: continue to increase workloads Long:improve overall MET level   Row Name 08/07/19 0807 08/13/19 1551 08/14/19 1246         Exercise Goal Re-Evaluation   Exercise Goals Review  Increase Physical Activity;Increase Strength and Stamina;Understanding of Exercise Prescription  Increase Physical Activity;Increase Strength and Stamina;Understanding of Exercise Prescription  Increase Physical Activity;Increase Strength and Stamina;Able to understand and use rate of perceived exertion (RPE) scale;Able to understand and use Dyspnea scale;Knowledge and  understanding of Target Heart Rate Range (THRR);Able to check pulse independently;Understanding of Exercise Prescription     Comments  Dominica Severin is doing well in rehab.  He is staying active at home by staying busy in the yard.  He has mentioned that the ellipitcal bothers his knees and then wants to spend the day in his recliner.  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  We will look at rotating off the ellipitcal for next time.  He has not noticed much difference in his strength and stamina yet. They are walking more at other places, but no difference yet.  Dominica Severin is doing well in rehab.  We have moved him off of the elliptical because of his knees and they are beginning to feel better again.  He is on level 4 for the NuStep and level 5 on the recumbent  bike now.  We wil lcontinue to monitor his progress.  Reviewed home exercise with pt today.  Pt plans to join [employer] for exercise.  Reviewed THR, pulse, RPE, sign and symptoms, NTG use, and when to call 911 or MD.  Also discussed weather considerations and indoor options.  Pt voiced understanding.     Expected Outcomes  Short: Rotate off ellipitical  Long: Continue to improve stamina.  Short: Increase speed on the elliptical  Long: Continue to improve stamina.  Short: exercise one day per week outside program sessions  Long:  improve overall MET level        Discharge Exercise Prescription (Final Exercise Prescription Changes): Exercise Prescription Changes - 08/14/19 1200      Home Exercise Plan   Plans to continue exercise at  [facility] (comment)   [facility]   Frequency  Add 1 additional day to program exercise sessions.    Initial Home Exercises Provided  08/14/19       Nutrition:  Target Goals: Understanding of nutrition guidelines, daily intake of sodium <159m, cholesterol <2037m calories 30% from fat and 7% or less from saturated fats, daily to have 5 or more servings of fruits and  vegetables.  Education: Controlling Sodium/Reading Food Labels -Group verbal and written material supporting the discussion of sodium use in heart healthy nutrition. Review and explanation with models, verbal and written materials for utilization of the food label.   Education: General Nutrition Guidelines/Fats and Fiber: -Group instruction provided by verbal, written material, models and posters to present the general guidelines for heart healthy nutrition. Gives an explanation and review of dietary fats and fiber.   Biometrics: Pre Biometrics - 06/19/19 1112      Pre Biometrics   Height  6' 3.6" (1.92 m)    Weight  223 lb (101.2 kg)    BMI (Calculated)  27.44    Single Leg Stand  2.34 seconds        Nutrition Therapy Plan and Nutrition Goals: Nutrition Therapy & Goals - 07/04/19 1201      Nutrition Therapy   Diet  Low Na, HH    Protein (specify units)  85-90g    Whole Grain Foods  5 servings    Saturated Fats  12 max. grams    Fruits and Vegetables  5 servings/day    Sodium  1.5 grams      Personal Nutrition Goals   Nutrition Goal  ST: review paperwork and go over short term goals LT: Increase lung capacity, increased flexibilty, regain muscle lost when in hospital    Comments  Pt reports there is not such thing as a normal day of eating. Cereal with fruit (3xdays) and yogurt, other days go out and get eggs with grits or hashbrowns, and sometimes will get a biscuit. L: if have a big breakfast may not have lunch - typically fast food. D: wife home cooking  or going out to eat on the days when they go see grandaughter play. Discussed general healthy eating, pulmonary MNT. Pt would like to review information.      Intervention Plan   Intervention  Prescribe, educate and counsel regarding individualized specific dietary modifications aiming towards targeted core components such as weight, hypertension, lipid management, diabetes, heart failure and other comorbidities.;Nutrition  handout(s) given to patient.    Expected Outcomes  Short Term Goal: Understand basic principles of dietary content, such as calories, fat, sodium, cholesterol and nutrients.;Short Term Goal: A plan has been developed with  personal nutrition goals set during dietitian appointment.;Long Term Goal: Adherence to prescribed nutrition plan.       Nutrition Assessments: Nutrition Assessments - 06/19/19 1111      MEDFICTS Scores   Pre Score  39       MEDIFICTS Score Key:          ?70 Need to make dietary changes          40-70 Heart Healthy Diet         ? 40 Therapeutic Level Cholesterol Diet  Nutrition Goals Re-Evaluation:   Nutrition Goals Discharge (Final Nutrition Goals Re-Evaluation):   Psychosocial: Target Goals: Acknowledge presence or absence of significant depression and/or stress, maximize coping skills, provide positive support system. Participant is able to verbalize types and ability to use techniques and skills needed for reducing stress and depression.   Education: Depression - Provides group verbal and written instruction on the correlation between heart/lung disease and depressed mood, treatment options, and the stigmas associated with seeking treatment.   Education: Sleep Hygiene -Provides group verbal and written instruction about how sleep can affect your health.  Define sleep hygiene, discuss sleep cycles and impact of sleep habits. Review good sleep hygiene tips.    Education: Stress and Anxiety: - Provides group verbal and written instruction about the health risks of elevated stress and causes of high stress.  Discuss the correlation between heart/lung disease and anxiety and treatment options. Review healthy ways to manage with stress and anxiety.   Initial Review & Psychosocial Screening: Initial Psych Review & Screening - 06/14/19 1038      Initial Review   Current issues with  Current Sleep Concerns;Current Stress Concerns    Source of Stress Concerns   Chronic Illness    Comments  Sometimes he cannot sleep well. He wakes up with aches and pains at times.      Family Dynamics   Good Support System?  Yes    Comments  He can look to his wife, daughters and 4 grandchildren for support.      Barriers   Psychosocial barriers to participate in program  The patient should benefit from training in stress management and relaxation.      Screening Interventions   Interventions  Encouraged to exercise;To provide support and resources with identified psychosocial needs;Provide feedback about the scores to participant    Expected Outcomes  Short Term goal: Utilizing psychosocial counselor, staff and physician to assist with identification of specific Stressors or current issues interfering with healing process. Setting desired goal for each stressor or current issue identified.;Long Term Goal: Stressors or current issues are controlled or eliminated.;Short Term goal: Identification and review with participant of any Quality of Life or Depression concerns found by scoring the questionnaire.;Long Term goal: The participant improves quality of Life and PHQ9 Scores as seen by post scores and/or verbalization of changes       Quality of Life Scores:  Scores of 19 and below usually indicate a poorer quality of life in these areas.  A difference of  2-3 points is a clinically meaningful difference.  A difference of 2-3 points in the total score of the Quality of Life Index has been associated with significant improvement in overall quality of life, self-image, physical symptoms, and general health in studies assessing change in quality of life.  PHQ-9: Recent Review Flowsheet Data    Depression screen [facility] 2/9 06/19/2019   Decreased Interest 0   Down, Depressed, Hopeless 0   PHQ -  2 Score 0   Altered sleeping 1   Tired, decreased energy 0   Change in appetite 1   Feeling bad or failure about yourself  0   Trouble concentrating 0   Moving slowly or  fidgety/restless 0   Suicidal thoughts 0   PHQ-9 Score 2   Difficult doing work/chores Not difficult at all     Interpretation of Total Score  Total Score Depression Severity:  1-4 = Minimal depression, 5-9 = Mild depression, 10-14 = Moderate depression, 15-19 = Moderately severe depression, 20-27 = Severe depression   Psychosocial Evaluation and Intervention: Psychosocial Evaluation - 06/14/19 1042      Psychosocial Evaluation & Interventions   Interventions  Encouraged to exercise with the program and follow exercise prescription    Comments  patient has a positive outlook on his health and is read to start pulmonary rehab.    Expected Outcomes  Short: Attend [facility] stress management education to decrease stress. Long: Maintain exercise [facility] to keep stress at a minimum.    Continue Psychosocial Services   Follow up required by staff       Psychosocial Re-Evaluation: Psychosocial Re-Evaluation    Row Name 07/10/19 [phone] 08/07/19 0816           Psychosocial Re-Evaluation   Current issues with  Current Stress Concerns;Current Sleep Concerns  Current Stress Concerns;Current Sleep Concerns      Comments  Dominica Severin wakes up a number of times during the night.  His sleep "could be better".  He does use his CPAP.  He doesnt have any particular stress.  Dominica Severin is doing well in rehab. He feels that he is sleeping better, but still has nights with multiple wake ups.  He has a granddaughter getting ready to play in [employer] [facility] and they will be traveling for it. She is pitcher and undeafeated, she already has a full ride to [facility].      Expected Outcomes  Short: continue to use CPAP as directed Long: develop better sleep patterns  Short: Enjoy granddaughter's championship series  Long: Continue to work on sleep.      Interventions  --  Encouraged to attend Pulmonary Rehabilitation for the exercise      Continue Psychosocial Services   --  Follow up required by staff          Psychosocial Discharge (Final Psychosocial Re-Evaluation): Psychosocial Re-Evaluation - 08/07/19 0816      Psychosocial Re-Evaluation   Current issues with  Current Stress Concerns;Current Sleep Concerns    Comments  Dominica Severin is doing well in rehab. He feels that he is sleeping better, but still has nights with multiple wake ups.  He has a granddaughter getting ready to play in [employer] and they will be traveling for it. She is pitcher and undeafeated, she already has a full ride to [facility].    Expected Outcomes  Short: Enjoy granddaughter's championship series  Long: Continue to work on sleep.    Interventions  Encouraged to attend Pulmonary Rehabilitation for the exercise    Continue Psychosocial Services   Follow up required by staff       Education: Education Goals: Education classes will be provided on a weekly basis, covering required topics. Participant will state understanding/return demonstration of topics presented.  Learning Barriers/Preferences: Learning Barriers/Preferences - 06/14/19 1039      Learning Barriers/Preferences   Learning Barriers  None    Learning Preferences  None       General Pulmonary Education  Topics:  Infection Prevention: - Provides verbal and written material to individual with discussion of infection control including proper hand washing and proper equipment cleaning during exercise session.   Pulmonary Rehab from 06/14/2019 in [facility] Cardiac and Pulmonary Rehab  Date  06/14/19  Educator  [facility]  Instruction Review Code  1- Verbalizes Understanding      Falls Prevention: - Provides verbal and written material to individual with discussion of falls prevention and safety.   Pulmonary Rehab from 06/14/2019 in [facility] Cardiac and Pulmonary Rehab  Date  06/14/19  Educator  [facility]  Instruction Review Code  1- Verbalizes Understanding      Chronic Lung Diseases: - Group verbal and written instruction to review updates, respiratory medications,  advancements in procedures and treatments. Discuss use of supplemental oxygen including available portable oxygen systems, continuous and intermittent flow rates, concentrators, personal use and safety guidelines. Review proper use of inhaler and spacers. Provide informative websites for self-education.    Energy Conservation: - Provide group verbal and written instruction for methods to conserve energy, plan and organize activities. Instruct on pacing techniques, use of adaptive equipment and posture/positioning to relieve shortness of breath.   Triggers and Exacerbations: - Group verbal and written instruction to review types of environmental triggers and ways to prevent exacerbations. Discuss weather changes, air quality and the benefits of nasal washing. Review warning signs and symptoms to help prevent infections. Discuss techniques for effective airway clearance, coughing, and vibrations.   AED/CPR: - Group verbal and written instruction with the use of models to demonstrate the basic use of the AED with the basic ABC's of resuscitation.   Anatomy and Physiology of the Lungs: - Group verbal and written instruction with the use of models to provide basic lung anatomy and physiology related to function, structure and complications of lung disease.   Anatomy & Physiology of the Heart: - Group verbal and written instruction and models provide basic cardiac anatomy and physiology, with the coronary electrical and arterial systems. Review of Valvular disease and Heart Failure   Cardiac Medications: - Group verbal and written instruction to review commonly prescribed medications for heart disease. Reviews the medication, class of the drug, and side effects.   Other: -Provides group and verbal instruction on various topics (see comments)   Knowledge Questionnaire Score: Knowledge Questionnaire Score - 06/19/19 1111      Knowledge Questionnaire Score   Pre Score  17/18 Education  Focus: O2 safety        Core Components/Risk Factors/Patient Goals at Admission: Personal Goals and Risk Factors at Admission - 06/19/19 1111      Core Components/Risk Factors/Patient Goals on Admission    Weight Management  Yes;Weight Loss    Intervention  Weight Management: Develop a combined nutrition and exercise program designed to reach desired caloric intake, while maintaining appropriate intake of nutrient and fiber, sodium and fats, and appropriate energy expenditure required for the weight goal.;Weight Management: Provide education and appropriate resources to help participant work on and attain dietary goals.    Admit Weight  223 lb (101.2 kg)    Goal Weight: Short Term  218 lb (98.9 kg)    Goal Weight: Long Term  208 lb (94.3 kg)    Expected Outcomes  Short Term: Continue to assess and modify interventions until short term weight is achieved;Long Term: Adherence to nutrition and physical activity/exercise program aimed toward attainment of established weight goal;Weight Loss: Understanding of general recommendations for a balanced deficit meal plan, which promotes  1-2 lb weight loss per week and includes a negative energy balance of [phone] kcal/d;Understanding recommendations for meals to include 15-35% energy as protein, 25-35% energy from fat, 35-60% energy from carbohydrates, less than 235m of dietary cholesterol, 20-35 gm of total fiber daily;Understanding of distribution of calorie intake throughout the day with the consumption of 4-5 meals/snacks    Improve shortness of breath with ADL's  Yes    Intervention  Provide education, individualized exercise plan and daily activity instruction to help decrease symptoms of SOB with activities of daily living.    Expected Outcomes  Short Term: Improve cardiorespiratory fitness to achieve a reduction of symptoms when performing ADLs;Long Term: Be able to perform more ADLs without symptoms or delay the onset of symptoms    Heart Failure   Yes    Intervention  Provide a combined exercise and nutrition program that is supplemented with education, support and counseling about heart failure. Directed toward relieving symptoms such as shortness of breath, decreased exercise tolerance, and extremity edema.    Expected Outcomes  Improve functional capacity of life;Short term: Attendance in program 2-3 days a week with increased exercise capacity. Reported lower sodium intake. Reported increased fruit and vegetable intake. Reports medication compliance.;Short term: Daily weights obtained and reported for increase. Utilizing diuretic protocols set by physician.;Long term: Adoption of self-care skills and reduction of barriers for early signs and symptoms recognition and intervention leading to self-care maintenance.    Hypertension  Yes    Intervention  Provide education on lifestyle modifcations including regular physical activity/exercise, weight management, moderate sodium restriction and increased consumption of fresh fruit, vegetables, and low fat dairy, alcohol moderation, and smoking cessation.;Monitor prescription use compliance.    Expected Outcomes  Short Term: Continued assessment and intervention until BP is < 140/974mHG in hypertensive participants. < 130/8011mG in hypertensive participants with diabetes, heart failure or chronic kidney disease.;Long Term: Maintenance of blood pressure at goal levels.       Education:Diabetes - Individual verbal and written instruction to review signs/symptoms of diabetes, desired ranges of glucose level fasting, after meals and with exercise. Acknowledge that pre and post exercise glucose checks will be done for 3 sessions at entry of program.   Education: Know Your Numbers and Risk Factors: -Group verbal and written instruction about important numbers in your health.  Discussion of what are risk factors and how they play a role in the disease process.  Review of Cholesterol, Blood Pressure,  Diabetes, and BMI and the role they play in your overall health.   Core Components/Risk Factors/Patient Goals Review:  Goals and Risk Factor Review    Row Name 07/10/19 0834 08/07/19 0813           Core Components/Risk Factors/Patient Goals Review   Personal Goals Review  Weight Management/Obesity;Develop more efficient breathing techniques such as purse lipped breathing and diaphragmatic breathing and practicing self-pacing with activity.;Improve shortness of breath with ADL's  Weight Management/Obesity;Improve shortness of breath with ADL's;Heart Failure      Review  GarDominica Severins noticed his walking is better - he can do stuff longer than he could before.  The elliptical makes him tired - we discussed the long term gains from exercise.  He lost 30+ lb during Covid and had a lung surgery.  He would like to be able to do yard work.  He takes meds as directed.  GarDominica Severin doing well in rehab.  His breathing is doing well, he just notes that he gets tired.  His weight has been staying steady.    Blood pressures have been good and he continues to check it at home.  He denies symptoms of heart failure currently.  He does have some feet swelling on occasion.  He has let his doctor know a little bit about it.      Expected Outcomes  Short:  continue to attend consistently Long:  manage risk factors and regain strength  Short: Continue to watch heart failure symptoms  Long; Continue to work toward weight loss.         Core Components/Risk Factors/Patient Goals at Discharge (Final Review):  Goals and Risk Factor Review - 08/07/19 0813      Core Components/Risk Factors/Patient Goals Review   Personal Goals Review  Weight Management/Obesity;Improve shortness of breath with ADL's;Heart Failure    Review  Dominica Severin is doing well in rehab.  His breathing is doing well, he just notes that he gets tired. His weight has been staying steady.    Blood pressures have been good and he continues to check it at home.  He denies  symptoms of heart failure currently.  He does have some feet swelling on occasion.  He has let his doctor know a little bit about it.    Expected Outcomes  Short: Continue to watch heart failure symptoms  Long; Continue to work toward weight loss.       ITP Comments: ITP Comments    Row Name 06/14/19 1045 06/19/19 1100 06/20/19 0603 06/21/19 0821 07/04/19 1217   ITP Comments  Virtual Orientation performed. Patient informed when to come in for RD and EP orientation. Diagnosis can be found in [facility] 06/01/2019.  Completed 6MWT and gym orientation.  Initial ITP created and sent for review to Dr. Emily Filbert, Medical Director.  30 day chart review completed. ITP sent to Dr Zachery Dakins Medical Director, for review,changes as needed and signature. Continue with ITP if no changes requested New to program  First full day of exercise!  Patient was oriented to gym and equipment including functions, settings, policies, and procedures.  Patient's individual exercise prescription and treatment plan were reviewed.  All starting workloads were established based on the results of the 6 minute walk test done at initial orientation visit.  The plan for exercise progression was also introduced and progression will be customized based on patient's performance and goals.  Completed Initial RD Evaluation   Row Name 07/18/19 0531 08/15/19 0540         ITP Comments  30 Day review completed. Medical Director review done, changes made as directed,and approval shown by signature of Market researcher.  30 Day review completed. ITP review done, changes made as directed,and approval shown by signature of  Scientist, research (life sciences).         Comments:

## 2019-08-16 ENCOUNTER — Other Ambulatory Visit: Payer: Self-pay

## 2019-08-16 DIAGNOSIS — J869 Pyothorax without fistula: Secondary | ICD-10-CM

## 2019-08-16 DIAGNOSIS — J439 Emphysema, unspecified: Secondary | ICD-10-CM | POA: Diagnosis not present

## 2019-08-16 NOTE — Progress Notes (Signed)
Daily Session Note  Patient Details  Name: BREWSTER WOLTERS MRN: [id number] Date of Birth: June 15, 1945 Referring Provider:     Pulmonary Rehab from 06/19/2019 in [facility] Cardiac and Pulmonary Rehab  Referring Provider  Susa Simmonds MD      Encounter Date: 08/16/2019  Check In: Session Check In - 08/16/19 0816      Check-In   Supervising physician immediately available to respond to emergencies  See telemetry face sheet for immediately available ER MD    Location  [facility]-Cardiac & Pulmonary Rehab    Staff Present  Vida Rigger RN, BSN;Jessica Luan Pulling, MA, RCEP, CCRP, CCET;Amanda Sommer, BA, ACSM CEP, Exercise Physiologist;Melissa Caiola RDN, LDN    Virtual Visit  No    Medication changes reported      No    Fall or balance concerns reported     No    Warm-up and Cool-down  Performed on first and last piece of equipment    Resistance Training Performed  Yes    VAD Patient?  No    PAD/SET Patient?  No      Pain Assessment   Currently in Pain?  No/denies          Social History   Tobacco Use  Smoking Status Former Smoker  . Packs/day: 1.00  . Years: 15.00  . Pack years: 15.00  . Types: Cigarettes  . Quit date: 67  . Years since quitting: 41.4  Smokeless Tobacco Never Used    Goals Met:  Proper associated with RPD/PD & O2 Sat Independence with exercise equipment Using PLB without cueing & demonstrates good technique Exercise tolerated well No report of cardiac concerns or symptoms Strength training completed today  Goals Unmet:  Not Applicable  Comments: Pt able to follow exercise prescription today without complaint.  Will continue to monitor for progression.   Dr. Emily Filbert is Medical Director for [place] and [facility].

## 2019-08-21 ENCOUNTER — Encounter: Payer: Medicare Other | Admitting: *Deleted

## 2019-08-21 ENCOUNTER — Other Ambulatory Visit: Payer: Self-pay

## 2019-08-21 DIAGNOSIS — J869 Pyothorax without fistula: Secondary | ICD-10-CM

## 2019-08-21 DIAGNOSIS — J439 Emphysema, unspecified: Secondary | ICD-10-CM | POA: Diagnosis not present

## 2019-08-21 NOTE — Patient Instructions (Signed)
Discharge Patient Instructions  Patient Details  Name: Ricky Petersen MRN: [id number] Date of Birth: 1945-11-08 Referring Provider:  Ottie Glazier, MD   Number of Visits: 26  Reason for Discharge:  Patient reached a stable level of exercise. Patient independent in their exercise.  Smoking History:  Social History   Tobacco Use  Smoking Status Former Smoker  . Packs/day: 1.00  . Years: 15.00  . Pack years: 15.00  . Types: Cigarettes  . Quit date: 37  . Years since quitting: 41.4  Smokeless Tobacco Never Used    Diagnosis:  Empyema ([place])  Initial Exercise Prescription: Initial Exercise Prescription - 06/19/19 1100      Date of Initial Exercise RX and Referring Provider   Date  06/19/19    Referring Provider  Susa Simmonds MD      Treadmill   MPH  2.7    Grade  0.5    Minutes  15    METs  3.25      Elliptical   Level  1    Speed  2.7    Minutes  15    METs  3      REL-XR   Level  3    Speed  50    Minutes  15    METs  3      Prescription Details   Frequency (times per week)  2    Duration  Progress to 30 minutes of continuous aerobic without signs/symptoms of physical distress      Intensity   THRR 40-80% of Max Heartrate  96-129    Ratings of Perceived Exertion  11-13    Perceived Dyspnea  0-4      Progression   Progression  Continue to progress workloads to maintain intensity without signs/symptoms of physical distress.      Resistance Training   Training Prescription  Yes    Weight  4 lb    Reps  10-15       Discharge Exercise Prescription (Final Exercise Prescription Changes): Exercise Prescription Changes - 08/14/19 1200      Home Exercise Plan   Plans to continue exercise at  [facility] (comment)   [facility]   Frequency  Add 1 additional day to program exercise sessions.    Initial Home Exercises Provided  08/14/19       Functional Capacity: 6 Minute Walk    Row Name 06/19/19 1100         6 Minute Walk   Phase   Initial     Distance  1435 feet     Walk Time  6 minutes     # of Rest Breaks  0     MPH  2.72     METS  3.1     RPE  11     Perceived Dyspnea   2     VO2 Peak  10.84     Symptoms  Yes (comment)     Comments  SOB, fatigue at end     Resting HR  63 bpm     Resting BP  108/64     Resting Oxygen Saturation   92 %     Exercise Oxygen Saturation  during 6 min walk  90 %     Max Ex. HR  103 bpm     Max Ex. BP  134/54     2 Minute Post BP  126/54       Interval HR   1  Minute HR  93     2 Minute HR  102     3 Minute HR  103     4 Minute HR  102     5 Minute HR  101     6 Minute HR  99     2 Minute Post HR  65     Interval Heart Rate?  Yes       Interval Oxygen   Interval Oxygen?  Yes     Baseline Oxygen Saturation %  92 %     1 Minute Oxygen Saturation %  95 %     1 Minute Liters of Oxygen  0 L Room Air     2 Minute Oxygen Saturation %  94 %     2 Minute Liters of Oxygen  0 L     3 Minute Oxygen Saturation %  92 %     3 Minute Liters of Oxygen  0 L     4 Minute Oxygen Saturation %  91 %     4 Minute Liters of Oxygen  0 L     5 Minute Oxygen Saturation %  90 %     5 Minute Liters of Oxygen  0 L     6 Minute Oxygen Saturation %  90 %     6 Minute Liters of Oxygen  0 L     2 Minute Post Oxygen Saturation %  97 %     2 Minute Post Liters of Oxygen  0 L        Quality of Life:   Personal Goals: Goals established at orientation with interventions provided to work toward goal. Personal Goals and Risk Factors at Admission - 06/19/19 1111      Core Components/Risk Factors/Patient Goals on Admission    Weight Management  Yes;Weight Loss    Intervention  Weight Management: Develop a combined nutrition and exercise program designed to reach desired caloric intake, while maintaining appropriate intake of nutrient and fiber, sodium and fats, and appropriate energy expenditure required for the weight goal.;Weight Management: Provide education and appropriate resources to help  participant work on and attain dietary goals.    Admit Weight  223 lb (101.2 kg)    Goal Weight: Short Term  218 lb (98.9 kg)    Goal Weight: Long Term  208 lb (94.3 kg)    Expected Outcomes  Short Term: Continue to assess and modify interventions until short term weight is achieved;Long Term: Adherence to nutrition and physical activity/exercise program aimed toward attainment of established weight goal;Weight Loss: Understanding of general recommendations for a balanced deficit meal plan, which promotes 1-2 lb weight loss per week and includes a negative energy balance of [phone] kcal/d;Understanding recommendations for meals to include 15-35% energy as protein, 25-35% energy from fat, 35-60% energy from carbohydrates, less than 238m of dietary cholesterol, 20-35 gm of total fiber daily;Understanding of distribution of calorie intake throughout the day with the consumption of 4-5 meals/snacks    Improve shortness of breath with ADL's  Yes    Intervention  Provide education, individualized exercise plan and daily activity instruction to help decrease symptoms of SOB with activities of daily living.    Expected Outcomes  Short Term: Improve cardiorespiratory fitness to achieve a reduction of symptoms when performing ADLs;Long Term: Be able to perform more ADLs without symptoms or delay the onset of symptoms    Heart Failure  Yes    Intervention  Provide a  combined exercise and nutrition program that is supplemented with education, support and counseling about heart failure. Directed toward relieving symptoms such as shortness of breath, decreased exercise tolerance, and extremity edema.    Expected Outcomes  Improve functional capacity of life;Short term: Attendance in program 2-3 days a week with increased exercise capacity. Reported lower sodium intake. Reported increased fruit and vegetable intake. Reports medication compliance.;Short term: Daily weights obtained and reported for increase. Utilizing  diuretic protocols set by physician.;Long term: Adoption of self-care skills and reduction of barriers for early signs and symptoms recognition and intervention leading to self-care maintenance.    Hypertension  Yes    Intervention  Provide education on lifestyle modifcations including regular physical activity/exercise, weight management, moderate sodium restriction and increased consumption of fresh fruit, vegetables, and low fat dairy, alcohol moderation, and smoking cessation.;Monitor prescription use compliance.    Expected Outcomes  Short Term: Continued assessment and intervention until BP is < 140/7m HG in hypertensive participants. < 130/838mHG in hypertensive participants with diabetes, heart failure or chronic kidney disease.;Long Term: Maintenance of blood pressure at goal levels.        Personal Goals Discharge: Goals and Risk Factor Review - 08/21/19 0827      Core Components/Risk Factors/Patient Goals Review   Personal Goals Review  Weight Management/Obesity;Improve shortness of breath with ADL's;Heart Failure    Review  Ricky Petersen done well in rehab.  His weigth has stayed steady but he would like to lose some more but maintain his strength.  He has not had any heart failure symptoms.  He is uses his zones as to when to call doctor.  He is now in the habit of checking his pressure regularly as well.    Expected Outcomes  Continued management of heart failure and risk factors.       Exercise Goals and Review: Exercise Goals    Row Name 06/19/19 1110             Exercise Goals   Increase Physical Activity  Yes       Intervention  Provide advice, education, support and counseling about physical activity/exercise needs.;Develop an individualized exercise prescription for aerobic and resistive training based on initial evaluation findings, risk stratification, comorbidities and participant's personal goals.       Expected Outcomes  Short Term: Attend rehab on a regular basis  to increase amount of physical activity.;Long Term: Add in home exercise to make exercise part of routine and to increase amount of physical activity.;Long Term: Exercising regularly at least 3-5 days a week.       Increase Strength and Stamina  Yes       Intervention  Provide advice, education, support and counseling about physical activity/exercise needs.;Develop an individualized exercise prescription for aerobic and resistive training based on initial evaluation findings, risk stratification, comorbidities and participant's personal goals.       Expected Outcomes  Short Term: Increase workloads from initial exercise prescription for resistance, speed, and METs.;Short Term: Perform resistance training exercises routinely during rehab and add in resistance training at home;Long Term: Improve cardiorespiratory fitness, muscular endurance and strength as measured by increased METs and functional capacity (6MWT)       Able to understand and use rate of perceived exertion (RPE) scale  Yes       Intervention  Provide education and explanation on how to use RPE scale       Expected Outcomes  Long Term:  Able to use RPE to guide  intensity level when exercising independently;Short Term: Able to use RPE daily in rehab to express subjective intensity level       Able to understand and use Dyspnea scale  Yes       Intervention  Provide education and explanation on how to use Dyspnea scale       Expected Outcomes  Short Term: Able to use Dyspnea scale daily in rehab to express subjective sense of shortness of breath during exertion;Long Term: Able to use Dyspnea scale to guide intensity level when exercising independently       Knowledge and understanding of Target Heart Rate Range (THRR)  Yes       Intervention  Provide education and explanation of THRR including how the numbers were predicted and where they are located for reference       Expected Outcomes  Short Term: Able to use daily as guideline for  intensity in rehab;Long Term: Able to use THRR to govern intensity when exercising independently;Short Term: Able to state/look up THRR       Able to check pulse independently  Yes       Intervention  Review the importance of being able to check your own pulse for safety during independent exercise;Provide education and demonstration on how to check pulse in carotid and radial arteries.       Expected Outcomes  Short Term: Able to explain why pulse checking is important during independent exercise;Long Term: Able to check pulse independently and accurately       Understanding of Exercise Prescription  Yes       Intervention  Provide education, explanation, and written materials on patient's individual exercise prescription       Expected Outcomes  Short Term: Able to explain program exercise prescription;Long Term: Able to explain home exercise prescription to exercise independently          Exercise Goals Re-Evaluation: Exercise Goals Re-Evaluation    Row Name 06/21/19 7893 07/03/19 1556 07/10/19 0840 07/18/19 1134 07/31/19 1544     Exercise Goal Re-Evaluation   Exercise Goals Review  Able to understand and use rate of perceived exertion (RPE) scale;Knowledge and understanding of Target Heart Rate Range (THRR);Able to check pulse independently;Understanding of Exercise Prescription  Increase Physical Activity;Increase Strength and Stamina;Able to understand and use rate of perceived exertion (RPE) scale;Able to understand and use Dyspnea scale;Knowledge and understanding of Target Heart Rate Range (THRR);Able to check pulse independently;Understanding of Exercise Prescription  Increase Physical Activity;Increase Strength and Stamina;Able to understand and use rate of perceived exertion (RPE) scale;Able to understand and use Dyspnea scale;Knowledge and understanding of Target Heart Rate Range (THRR);Able to check pulse independently;Understanding of Exercise Prescription  Increase Physical  Activity;Increase Strength and Stamina;Understanding of Exercise Prescription  Increase Physical Activity;Increase Strength and Stamina;Able to understand and use rate of perceived exertion (RPE) scale;Able to understand and use Dyspnea scale;Knowledge and understanding of Target Heart Rate Range (THRR);Able to check pulse independently;Understanding of Exercise Prescription   Comments  Reviewed RPE scale, THR and program prescription with pt today.  Pt voiced understanding and was given a copy of goals to take home.  Gary tolerates exercise well.  Oxygen stayed above 88%.  Staff will monitor progress.  Ricky Petersen lost 30+ lb during his Covid event.  he wants to be able to do yard work and regain strength and stamina.  He did 2 sets of 6 min on elliptical  Ricky Petersen is doing well in rehab.  He is now up to level 6  on the XR and 2.8 mph on the treadmill.  We will continue to monitor his progress.  Ricky Petersen attends consistently and his O2 sats have stayed in the high 90s.   Expected Outcomes  Short: Use RPE daily to regulate intensity. Long: Follow program prescription in THR.  Short: attend consistently Long: build overall stamina  Short:  be able to do elliptical for 15 min straight  Long: build overall stamina  Short: Continue to add to workloads  Long: Continue to improve stamina.  Short: continue to increase workloads Long:improve overall MET level   Row Name 08/07/19 0807 08/13/19 1551 08/14/19 1246 08/21/19 0821       Exercise Goal Re-Evaluation   Exercise Goals Review  Increase Physical Activity;Increase Strength and Stamina;Understanding of Exercise Prescription  Increase Physical Activity;Increase Strength and Stamina;Understanding of Exercise Prescription  Increase Physical Activity;Increase Strength and Stamina;Able to understand and use rate of perceived exertion (RPE) scale;Able to understand and use Dyspnea scale;Knowledge and understanding of Target Heart Rate Range (THRR);Able to check pulse  independently;Understanding of Exercise Prescription  Increase Physical Activity;Increase Strength and Stamina;Understanding of Exercise Prescription    Comments  Ricky Petersen is doing well in rehab.  He is staying active at home by staying busy in the yard.  He has mentioned that the ellipitcal bothers his knees and then wants to spend the day in his recliner.  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  We will look at rotating off the ellipitcal for next time.  He has not noticed much difference in his strength and stamina yet. They are walking more at other places, but no difference yet.  Ricky Petersen is doing well in rehab.  We have moved him off of the elliptical because of his knees and they are beginning to feel better again.  He is on level 4 for the NuStep and level 5 on the recumbent bike now.  We wil lcontinue to monitor his progress.  Reviewed home exercise with pt today.  Pt plans to join [employer] for exercise.  Reviewed THR, pulse, RPE, sign and symptoms, NTG use, and when to call 911 or MD.  Also discussed weather considerations and indoor options.  Pt voiced understanding.  Ricky Petersen is doing well in rehab.  He is walking at home some.  He has not joined the gym yet but is planning to maybe join the [employer].  He starts to travel more in June and July and would like to graduate on Thursday.    Expected Outcomes  Short: Rotate off ellipitical  Long: Continue to improve stamina.  Short: Increase speed on the elliptical  Long: Continue to improve stamina.  Short: exercise one day per week outside program sessions  Long:  improve overall MET level  Improve post 6MWT and join gym to continue to exercise.       Nutrition & Weight - Outcomes: Pre Biometrics - 06/19/19 1112      Pre Biometrics   Height  6' 3.6" (1.92 m)    Weight  223 lb (101.2 kg)    BMI (Calculated)  27.44    Single Leg Stand  2.34 seconds        Nutrition: Nutrition Therapy & Goals - 07/04/19 1201       Nutrition Therapy   Diet  Low Na, HH    Protein (specify units)  85-90g    Whole Grain Foods  5 servings    Saturated Fats  12  max. grams    Fruits and Vegetables  5 servings/day    Sodium  1.5 grams      Personal Nutrition Goals   Nutrition Goal  ST: review paperwork and go over short term goals LT: Increase lung capacity, increased flexibilty, regain muscle lost when in hospital    Comments  Pt reports there is not such thing as a normal day of eating. Cereal with fruit (3xdays) and yogurt, other days go out and get eggs with grits or hashbrowns, and sometimes will get a biscuit. L: if have a big breakfast may not have lunch - typically fast food. D: wife home cooking  or going out to eat on the days when they go see grandaughter play. Discussed general healthy eating, pulmonary MNT. Pt would like to review information.      Intervention Plan   Intervention  Prescribe, educate and counsel regarding individualized specific dietary modifications aiming towards targeted core components such as weight, hypertension, lipid management, diabetes, heart failure and other comorbidities.;Nutrition handout(s) given to patient.    Expected Outcomes  Short Term Goal: Understand basic principles of dietary content, such as calories, fat, sodium, cholesterol and nutrients.;Short Term Goal: A plan has been developed with personal nutrition goals set during dietitian appointment.;Long Term Goal: Adherence to prescribed nutrition plan.       Nutrition Discharge: Nutrition Assessments - 06/19/19 1111      MEDFICTS Scores   Pre Score  39       Education Questionnaire Score: Knowledge Questionnaire Score - 06/19/19 1111      Knowledge Questionnaire Score   Pre Score  17/18 Education Focus: O2 safety       Goals reviewed with patient; copy given to patient.

## 2019-08-21 NOTE — Progress Notes (Signed)
Daily Session Note  Patient Details  Name: Ricky Petersen MRN: [id number] Date of Birth: May 19, 1945 Referring Provider:     Pulmonary Rehab from 06/19/2019 in [facility]  Referring Provider  Susa Simmonds MD      Encounter Date: 08/21/2019  Check In: Session Check In - 08/21/19 0827      Check-In   Supervising physician immediately available to respond to emergencies  See telemetry face sheet for immediately available ER MD    Location  [facility]-Cardiac & Pulmonary Rehab    Staff Present  Heath Lark, RN, BSN, CCRP;Amanda Sommer, BA, ACSM CEP, Exercise Physiologist;Joseph Hood RCP,RRT,BSRT    Virtual Visit  No    Medication changes reported      No    Fall or balance concerns reported     No    Warm-up and Cool-down  Performed on first and last piece of equipment    Resistance Training Performed  Yes    VAD Patient?  No    PAD/SET Patient?  No      Pain Assessment   Currently in Pain?  No/denies          Social History   Tobacco Use  Smoking Status Former Smoker  . Packs/day: 1.00  . Years: 15.00  . Pack years: 15.00  . Types: Cigarettes  . Quit date: 41  . Years since quitting: 41.4  Smokeless Tobacco Never Used    Goals Met:  Proper associated with RPD/PD & O2 Sat Independence with exercise equipment Exercise tolerated well No report of cardiac concerns or symptoms  Goals Unmet:  Not Applicable  Comments: Pt able to follow exercise prescription today without complaint.  Will continue to monitor for progression.    Dr. Emily Filbert is Medical Director for [facility].

## 2019-08-23 ENCOUNTER — Encounter: Payer: Medicare Other | Admitting: *Deleted

## 2019-08-23 ENCOUNTER — Other Ambulatory Visit: Payer: Self-pay

## 2019-08-23 DIAGNOSIS — J439 Emphysema, unspecified: Secondary | ICD-10-CM | POA: Diagnosis not present

## 2019-08-23 DIAGNOSIS — J869 Pyothorax without fistula: Secondary | ICD-10-CM

## 2019-08-23 NOTE — Progress Notes (Signed)
Discharge Progress Report  Patient Details  Name: Ricky Petersen MRN: [id number] Date of Birth: [birth year] Referring Provider:     Pulmonary Rehab from 06/19/2019 in [facility]  Referring Provider  Susa Simmonds MD       Number of Visits: 19/36  Reason for Discharge:  Early Exit:  Personal  Smoking History:  Social History   Tobacco Use  Smoking Status Former Smoker  . Packs/day: 1.00  . Years: 15.00  . Pack years: 15.00  . Types: Cigarettes  . Quit date: 72  . Years since quitting: 41.4  Smokeless Tobacco Never Used    Diagnosis:  [place] ([place])  ADL [facility]: Pulmonary Assessment Scores    Row Name 06/19/19 1113 08/23/19 0914       ADL [facility]   ADL Phase  Entry  Exit    SOB Score total  26  20    Rest  0  --    Walk  2  2    Stairs  3  3    Bath  0  0    Dress  0  0    Shop  2  1      CAT Score   CAT Score  14  11      mMRC Score   mMRC Score  2  --       Initial Exercise Prescription: Initial Exercise Prescription - 06/19/19 1100      Date of Initial Exercise RX and Referring Provider   Date  06/19/19    Referring Provider  Susa Simmonds MD      Treadmill   MPH  2.7    Grade  0.5    Minutes  15    METs  3.25      Elliptical   Level  1    Speed  2.7    Minutes  15    METs  3      REL-XR   Level  3    Speed  50    Minutes  15    METs  3      Prescription Details   Frequency (times per week)  2    Duration  Progress to 30 minutes of continuous aerobic without signs/symptoms of physical distress      Intensity   THRR 40-80% of Max Heartrate  96-129    Ratings of Perceived Exertion  11-13    Perceived Dyspnea  0-4      Progression   Progression  Continue to progress workloads to maintain intensity without signs/symptoms of physical distress.      Resistance Training   Training Prescription  Yes    Weight  4 lb    Reps  10-15       Discharge Exercise Prescription (Final Exercise Prescription  Changes): Exercise Prescription Changes - 08/14/19 1200      Home Exercise Plan   Plans to continue exercise at  [facility] (comment)   [employer]   Frequency  Add 1 additional day to program exercise sessions.    Initial Home Exercises Provided  08/14/19       Functional Capacity: 6 Minute Walk    Row Name 06/19/19 1100 08/23/19 0840       6 Minute Walk   Phase  Initial  Discharge    Distance  1435 feet  1687 feet    Distance % Change  --  17.5 %    Distance Feet Change  --  252 ft    Walk Time  6 minutes  6 minutes    # of Rest Breaks  0  0    MPH  2.72  3.2    METS  3.1  3.67    RPE  11  12    Perceived Dyspnea   2  0    VO2 Peak  10.84  12.86    Symptoms  Yes (comment)  No    Comments  SOB, fatigue at end  --    Resting HR  63 bpm  52 bpm    Resting BP  108/64  106/70    Resting Oxygen Saturation   92 %  96 %    Exercise Oxygen Saturation  during 6 min walk  90 %  92 %    Max Ex. HR  103 bpm  99 bpm    Max Ex. BP  134/54  160/74    2 Minute Post BP  126/54  --      Interval HR   1 Minute HR  93  78    2 Minute HR  102  87    3 Minute HR  103  97    4 Minute HR  102  99    5 Minute HR  101  98    6 Minute HR  99  96    2 Minute Post HR  65  --    Interval Heart Rate?  Yes  Yes      Interval Oxygen   Interval Oxygen?  Yes  Yes    Baseline Oxygen Saturation %  92 %  96 %    1 Minute Oxygen Saturation %  95 %  94 %    1 Minute Liters of Oxygen  0 L Room Air  0 L    2 Minute Oxygen Saturation %  94 %  92 %    2 Minute Liters of Oxygen  0 L  0 L    3 Minute Oxygen Saturation %  92 %  92 %    3 Minute Liters of Oxygen  0 L  0 L    4 Minute Oxygen Saturation %  91 %  93 %    4 Minute Liters of Oxygen  0 L  0 L    5 Minute Oxygen Saturation %  90 %  92 %    5 Minute Liters of Oxygen  0 L  0 L    6 Minute Oxygen Saturation %  90 %  93 %    6 Minute Liters of Oxygen  0 L  0 L    2 Minute Post Oxygen Saturation %  97 %  --    2 Minute Post Liters of  Oxygen  0 L  0 L       Psychological, QOL, Others - Outcomes: PHQ 2/9: Depression screen [facility] 2/9 08/23/2019 06/19/2019  Decreased Interest 1 0  Down, Depressed, Hopeless 0 0  PHQ - 2 Score 1 0  Altered sleeping 1 1  Tired, decreased energy 1 0  Change in appetite 1 1  Feeling bad or failure about yourself  0 0  Trouble concentrating 0 0  Moving slowly or fidgety/restless 0 0  Suicidal thoughts 0 0  PHQ-9 Score 4 2  Difficult doing work/chores Not difficult at all Not difficult at all    Quality of Life:       Nutrition & Weight - Outcomes:  Pre Biometrics - 06/19/19 1112      Pre Biometrics   Height  6' 3.6" (1.92 m)    Weight  223 lb (101.2 kg)    BMI (Calculated)  27.44    Single Leg Stand  2.34 seconds        Nutrition: Nutrition Therapy & Goals - 07/04/19 1201      Nutrition Therapy   Diet  Low Na, HH    Protein (specify units)  85-90g    Whole Grain Foods  5 servings    Saturated Fats  12 max. grams    Fruits and Vegetables  5 servings/day    Sodium  1.5 grams      Personal Nutrition Goals   Nutrition Goal  ST: review paperwork and go over short term goals LT: Increase lung capacity, increased flexibilty, regain muscle lost when in hospital    Comments  Pt reports there is not such thing as a normal day of eating. Cereal with fruit (3xdays) and yogurt, other days go out and get eggs with grits or hashbrowns, and sometimes will get a biscuit. L: if have a big breakfast may not have lunch - typically fast food. D: wife home cooking  or going out to eat on the days when they go see grandaughter play. Discussed general healthy eating, pulmonary MNT. Pt would like to review information.      Intervention Plan   Intervention  Prescribe, educate and counsel regarding individualized specific dietary modifications aiming towards targeted core components such as weight, hypertension, lipid management, diabetes, heart failure and other comorbidities.;Nutrition  handout(s) given to patient.    Expected Outcomes  Short Term Goal: Understand basic principles of dietary content, such as calories, fat, sodium, cholesterol and nutrients.;Short Term Goal: A plan has been developed with personal nutrition goals set during dietitian appointment.;Long Term Goal: Adherence to prescribed nutrition plan.       Nutrition Discharge: Nutrition Assessments - 08/23/19 0916      MEDFICTS Scores   Pre Score  39    Post Score  52    Score Difference  13       Education Questionnaire Score: Knowledge Questionnaire Score - 08/23/19 0916      Knowledge Questionnaire Score   Pre Score  17/18 Education Focus: O2 safety    Post Score  15/18  correct responses were reviewed with Dominica Severin and he verbalized understanding       Goals reviewed with patient; copy given to patient.

## 2019-08-23 NOTE — Patient Instructions (Signed)
Discharge Patient Instructions  Patient Details  Name: Ricky Petersen MRN: [id number] Date of Birth: 1945-04-12 Referring Provider:  Ottie Glazier, MD   Number of Visits: 19  Reason for Discharge:  Early Exit:  Personal  Smoking History:  Social History   Tobacco Use  Smoking Status Former Smoker  . Packs/day: 1.00  . Years: 15.00  . Pack years: 15.00  . Types: Cigarettes  . Quit date: 46  . Years since quitting: 41.4  Smokeless Tobacco Never Used    Diagnosis:  Empyema ([place])  Initial Exercise Prescription: Initial Exercise Prescription - 06/19/19 1100      Date of Initial Exercise RX and Referring Provider   Date  06/19/19    Referring Provider  Susa Simmonds MD      Treadmill   MPH  2.7    Grade  0.5    Minutes  15    METs  3.25      Elliptical   Level  1    Speed  2.7    Minutes  15    METs  3      REL-XR   Level  3    Speed  50    Minutes  15    METs  3      Prescription Details   Frequency (times per week)  2    Duration  Progress to 30 minutes of continuous aerobic without signs/symptoms of physical distress      Intensity   THRR 40-80% of Max Heartrate  96-129    Ratings of Perceived Exertion  11-13    Perceived Dyspnea  0-4      Progression   Progression  Continue to progress workloads to maintain intensity without signs/symptoms of physical distress.      Resistance Training   Training Prescription  Yes    Weight  4 lb    Reps  10-15       Discharge Exercise Prescription (Final Exercise Prescription Changes): Exercise Prescription Changes - 08/14/19 1200      Home Exercise Plan   Plans to continue exercise at  [facility] (comment)   [facility]   Frequency  Add 1 additional day to program exercise sessions.    Initial Home Exercises Provided  08/14/19       Functional Capacity: 6 Minute Walk    Row Name 06/19/19 1100 08/23/19 0840       6 Minute Walk   Phase  Initial  Discharge    Distance  1435 feet  1687  feet    Distance % Change  --  17.5 %    Distance Feet Change  --  252 ft    Walk Time  6 minutes  6 minutes    # of Rest Breaks  0  0    MPH  2.72  3.2    METS  3.1  3.67    RPE  11  12    Perceived Dyspnea   2  0    VO2 Peak  10.84  12.86    Symptoms  Yes (comment)  No    Comments  SOB, fatigue at end  --    Resting HR  63 bpm  52 bpm    Resting BP  108/64  106/70    Resting Oxygen Saturation   92 %  96 %    Exercise Oxygen Saturation  during 6 min walk  90 %  92 %    Max Ex.  HR  103 bpm  99 bpm    Max Ex. BP  134/54  160/74    2 Minute Post BP  126/54  --      Interval HR   1 Minute HR  93  78    2 Minute HR  102  87    3 Minute HR  103  97    4 Minute HR  102  99    5 Minute HR  101  98    6 Minute HR  99  96    2 Minute Post HR  65  --    Interval Heart Rate?  Yes  Yes      Interval Oxygen   Interval Oxygen?  Yes  Yes    Baseline Oxygen Saturation %  92 %  96 %    1 Minute Oxygen Saturation %  95 %  94 %    1 Minute Liters of Oxygen  0 L Room Air  0 L    2 Minute Oxygen Saturation %  94 %  92 %    2 Minute Liters of Oxygen  0 L  0 L    3 Minute Oxygen Saturation %  92 %  92 %    3 Minute Liters of Oxygen  0 L  0 L    4 Minute Oxygen Saturation %  91 %  93 %    4 Minute Liters of Oxygen  0 L  0 L    5 Minute Oxygen Saturation %  90 %  92 %    5 Minute Liters of Oxygen  0 L  0 L    6 Minute Oxygen Saturation %  90 %  93 %    6 Minute Liters of Oxygen  0 L  0 L    2 Minute Post Oxygen Saturation %  97 %  --    2 Minute Post Liters of Oxygen  0 L  0 L       Quality of Life:   Personal Goals: Goals established at orientation with interventions provided to work toward goal. Personal Goals and Risk Factors at Admission - 06/19/19 1111      Core Components/Risk Factors/Patient Goals on Admission    Weight Management  Yes;Weight Loss    Intervention  Weight Management: Develop a combined nutrition and exercise program designed to reach desired caloric intake,  while maintaining appropriate intake of nutrient and fiber, sodium and fats, and appropriate energy expenditure required for the weight goal.;Weight Management: Provide education and appropriate resources to help participant work on and attain dietary goals.    Admit Weight  223 lb (101.2 kg)    Goal Weight: Short Term  218 lb (98.9 kg)    Goal Weight: Long Term  208 lb (94.3 kg)    Expected Outcomes  Short Term: Continue to assess and modify interventions until short term weight is achieved;Long Term: Adherence to nutrition and physical activity/exercise program aimed toward attainment of established weight goal;Weight Loss: Understanding of general recommendations for a balanced deficit meal plan, which promotes 1-2 lb weight loss per week and includes a negative energy balance of [phone] kcal/d;Understanding recommendations for meals to include 15-35% energy as protein, 25-35% energy from fat, 35-60% energy from carbohydrates, less than 228m of dietary cholesterol, 20-35 gm of total fiber daily;Understanding of distribution of calorie intake throughout the day with the consumption of 4-5 meals/snacks    Improve shortness of breath with ADL's  Yes    Intervention  Provide education, individualized exercise plan and daily activity instruction to help decrease symptoms of SOB with activities of daily living.    Expected Outcomes  Short Term: Improve cardiorespiratory fitness to achieve a reduction of symptoms when performing ADLs;Long Term: Be able to perform more ADLs without symptoms or delay the onset of symptoms    Heart Failure  Yes    Intervention  Provide a combined exercise and nutrition program that is supplemented with education, support and counseling about heart failure. Directed toward relieving symptoms such as shortness of breath, decreased exercise tolerance, and extremity edema.    Expected Outcomes  Improve functional capacity of life;Short term: Attendance in program 2-3 days a week  with increased exercise capacity. Reported lower sodium intake. Reported increased fruit and vegetable intake. Reports medication compliance.;Short term: Daily weights obtained and reported for increase. Utilizing diuretic protocols set by physician.;Long term: Adoption of self-care skills and reduction of barriers for early signs and symptoms recognition and intervention leading to self-care maintenance.    Hypertension  Yes    Intervention  Provide education on lifestyle modifcations including regular physical activity/exercise, weight management, moderate sodium restriction and increased consumption of fresh fruit, vegetables, and low fat dairy, alcohol moderation, and smoking cessation.;Monitor prescription use compliance.    Expected Outcomes  Short Term: Continued assessment and intervention until BP is < 140/34m HG in hypertensive participants. < 130/818mHG in hypertensive participants with diabetes, heart failure or chronic kidney disease.;Long Term: Maintenance of blood pressure at goal levels.        Personal Goals Discharge: Goals and Risk Factor Review - 08/21/19 0827      Core Components/Risk Factors/Patient Goals Review   Personal Goals Review  Weight Management/Obesity;Improve shortness of breath with ADL's;Heart Failure    Review  GaDominica Severinas done well in rehab.  His weigth has stayed steady but he would like to lose some more but maintain his strength.  He has not had any heart failure symptoms.  He is uses his zones as to when to call doctor.  He is now in the habit of checking his pressure regularly as well.    Expected Outcomes  Continued management of heart failure and risk factors.       Exercise Goals and Review: Exercise Goals    Row Name 06/19/19 1110             Exercise Goals   Increase Physical Activity  Yes       Intervention  Provide advice, education, support and counseling about physical activity/exercise needs.;Develop an individualized exercise prescription  for aerobic and resistive training based on initial evaluation findings, risk stratification, comorbidities and participant's personal goals.       Expected Outcomes  Short Term: Attend rehab on a regular basis to increase amount of physical activity.;Long Term: Add in home exercise to make exercise part of routine and to increase amount of physical activity.;Long Term: Exercising regularly at least 3-5 days a week.       Increase Strength and Stamina  Yes       Intervention  Provide advice, education, support and counseling about physical activity/exercise needs.;Develop an individualized exercise prescription for aerobic and resistive training based on initial evaluation findings, risk stratification, comorbidities and participant's personal goals.       Expected Outcomes  Short Term: Increase workloads from initial exercise prescription for resistance, speed, and METs.;Short Term: Perform resistance training exercises routinely during rehab and add in resistance  training at home;Long Term: Improve cardiorespiratory fitness, muscular endurance and strength as measured by increased METs and functional capacity (6MWT)       Able to understand and use rate of perceived exertion (RPE) scale  Yes       Intervention  Provide education and explanation on how to use RPE scale       Expected Outcomes  Long Term:  Able to use RPE to guide intensity level when exercising independently;Short Term: Able to use RPE daily in rehab to express subjective intensity level       Able to understand and use Dyspnea scale  Yes       Intervention  Provide education and explanation on how to use Dyspnea scale       Expected Outcomes  Short Term: Able to use Dyspnea scale daily in rehab to express subjective sense of shortness of breath during exertion;Long Term: Able to use Dyspnea scale to guide intensity level when exercising independently       Knowledge and understanding of Target Heart Rate Range (THRR)  Yes        Intervention  Provide education and explanation of THRR including how the numbers were predicted and where they are located for reference       Expected Outcomes  Short Term: Able to use daily as guideline for intensity in rehab;Long Term: Able to use THRR to govern intensity when exercising independently;Short Term: Able to state/look up THRR       Able to check pulse independently  Yes       Intervention  Review the importance of being able to check your own pulse for safety during independent exercise;Provide education and demonstration on how to check pulse in carotid and radial arteries.       Expected Outcomes  Short Term: Able to explain why pulse checking is important during independent exercise;Long Term: Able to check pulse independently and accurately       Understanding of Exercise Prescription  Yes       Intervention  Provide education, explanation, and written materials on patient's individual exercise prescription       Expected Outcomes  Short Term: Able to explain program exercise prescription;Long Term: Able to explain home exercise prescription to exercise independently          Exercise Goals Re-Evaluation: Exercise Goals Re-Evaluation    Row Name 06/21/19 1660 07/03/19 1556 07/10/19 0840 07/18/19 1134 07/31/19 1544     Exercise Goal Re-Evaluation   Exercise Goals Review  Able to understand and use rate of perceived exertion (RPE) scale;Knowledge and understanding of Target Heart Rate Range (THRR);Able to check pulse independently;Understanding of Exercise Prescription  Increase Physical Activity;Increase Strength and Stamina;Able to understand and use rate of perceived exertion (RPE) scale;Able to understand and use Dyspnea scale;Knowledge and understanding of Target Heart Rate Range (THRR);Able to check pulse independently;Understanding of Exercise Prescription  Increase Physical Activity;Increase Strength and Stamina;Able to understand and use rate of perceived exertion (RPE)  scale;Able to understand and use Dyspnea scale;Knowledge and understanding of Target Heart Rate Range (THRR);Able to check pulse independently;Understanding of Exercise Prescription  Increase Physical Activity;Increase Strength and Stamina;Understanding of Exercise Prescription  Increase Physical Activity;Increase Strength and Stamina;Able to understand and use rate of perceived exertion (RPE) scale;Able to understand and use Dyspnea scale;Knowledge and understanding of Target Heart Rate Range (THRR);Able to check pulse independently;Understanding of Exercise Prescription   Comments  Reviewed RPE scale, THR and program prescription with pt today.  Pt voiced  understanding and was given a copy of goals to take home.  Gary tolerates exercise well.  Oxygen stayed above 88%.  Staff will monitor progress.  Dominica Severin lost 30+ lb during his Covid event.  he wants to be able to do yard work and regain strength and stamina.  He did 2 sets of 6 min on elliptical  Dominica Severin is doing well in rehab.  He is now up to level 6 on the XR and 2.8 mph on the treadmill.  We will continue to monitor his progress.  Dominica Severin attends consistently and his O2 sats have stayed in the high 90s.   Expected Outcomes  Short: Use RPE daily to regulate intensity. Long: Follow program prescription in THR.  Short: attend consistently Long: build overall stamina  Short:  be able to do elliptical for 15 min straight  Long: build overall stamina  Short: Continue to add to workloads  Long: Continue to improve stamina.  Short: continue to increase workloads Long:improve overall MET level   Row Name 08/07/19 0807 08/13/19 1551 08/14/19 1246 08/21/19 0821       Exercise Goal Re-Evaluation   Exercise Goals Review  Increase Physical Activity;Increase Strength and Stamina;Understanding of Exercise Prescription  Increase Physical Activity;Increase Strength and Stamina;Understanding of Exercise Prescription  Increase Physical Activity;Increase Strength and Stamina;Able  to understand and use rate of perceived exertion (RPE) scale;Able to understand and use Dyspnea scale;Knowledge and understanding of Target Heart Rate Range (THRR);Able to check pulse independently;Understanding of Exercise Prescription  Increase Physical Activity;Increase Strength and Stamina;Understanding of Exercise Prescription    Comments  Dominica Severin is doing well in rehab.  He is staying active at home by staying busy in the yard.  He has mentioned that the ellipitcal bothers his knees and then wants to spend the day in his recliner.  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  We will look at rotating off the ellipitcal for next time.  He has not noticed much difference in his strength and stamina yet. They are walking more at other places, but no difference yet.  Dominica Severin is doing well in rehab.  We have moved him off of the elliptical because of his knees and they are beginning to feel better again.  He is on level 4 for the NuStep and level 5 on the recumbent bike now.  We wil lcontinue to monitor his progress.  Reviewed home exercise with pt today.  Pt plans to join [employer] for exercise.  Reviewed THR, pulse, RPE, sign and symptoms, NTG use, and when to call 911 or MD.  Also discussed weather considerations and indoor options.  Pt voiced understanding.  Dominica Severin is doing well in rehab.  He is walking at home some.  He has not joined the gym yet but is planning to maybe join the [employer].  He starts to travel more in June and July and would like to graduate on Thursday.    Expected Outcomes  Short: Rotate off ellipitical  Long: Continue to improve stamina.  Short: Increase speed on the elliptical  Long: Continue to improve stamina.  Short: exercise one day per week outside program sessions  Long:  improve overall MET level  Improve post 6MWT and join gym to continue to exercise.       Nutrition & Weight - Outcomes: Pre Biometrics - 06/19/19 1112      Pre Biometrics   Height  6'  3.6" (1.92 m)  Weight  223 lb (101.2 kg)    BMI (Calculated)  27.44    Single Leg Stand  2.34 seconds        Nutrition: Nutrition Therapy & Goals - 07/04/19 1201      Nutrition Therapy   Diet  Low Na, HH    Protein (specify units)  85-90g    Whole Grain Foods  5 servings    Saturated Fats  12 max. grams    Fruits and Vegetables  5 servings/day    Sodium  1.5 grams      Personal Nutrition Goals   Nutrition Goal  ST: review paperwork and go over short term goals LT: Increase lung capacity, increased flexibilty, regain muscle lost when in hospital    Comments  Pt reports there is not such thing as a normal day of eating. Cereal with fruit (3xdays) and yogurt, other days go out and get eggs with grits or hashbrowns, and sometimes will get a biscuit. L: if have a big breakfast may not have lunch - typically fast food. D: wife home cooking  or going out to eat on the days when they go see grandaughter play. Discussed general healthy eating, pulmonary MNT. Pt would like to review information.      Intervention Plan   Intervention  Prescribe, educate and counsel regarding individualized specific dietary modifications aiming towards targeted core components such as weight, hypertension, lipid management, diabetes, heart failure and other comorbidities.;Nutrition handout(s) given to patient.    Expected Outcomes  Short Term Goal: Understand basic principles of dietary content, such as calories, fat, sodium, cholesterol and nutrients.;Short Term Goal: A plan has been developed with personal nutrition goals set during dietitian appointment.;Long Term Goal: Adherence to prescribed nutrition plan.       Nutrition Discharge: Nutrition Assessments - 06/19/19 1111      MEDFICTS Scores   Pre Score  39       Education Questionnaire Score: Knowledge Questionnaire Score - 06/19/19 1111      Knowledge Questionnaire Score   Pre Score  17/18 Education Focus: O2 safety       Goals reviewed with  patient; copy given to patient.

## 2019-08-23 NOTE — Progress Notes (Signed)
Pulmonary Individual Treatment Plan  Patient Details  Name: Ricky Petersen MRN: [id number] Date of Birth: September 03, 1945 Referring Provider:     Pulmonary Rehab from 06/19/2019 in [facility] Cardiac and Pulmonary Rehab  Referring Provider  Susa Simmonds MD      Initial Encounter Date:    Pulmonary Rehab from 06/19/2019 in [facility] Cardiac and Pulmonary Rehab  Date  06/19/19      Visit Diagnosis: Empyema ([place])  Patient's Home Medications on Admission:  Current Outpatient Medications:  .  aspirin EC 81 MG tablet, Take 81 mg by mouth daily., Disp: , Rfl:  .  Eliquis DVT/PE Starter Pack (ELIQUIS STARTER PACK) 5 MG TABS, Take as directed on package: start with two-8m tablets twice daily for 7 days. On day 8, switch to one-561mtablet twice daily., Disp: 1 each, Rfl: 0 .  fluticasone (FLONASE) 50 MCG/ACT nasal spray, Place 2 sprays into both nostrils daily., Disp: , Rfl:  .  HYDROcodone-acetaminophen (NORCO/VICODIN) 5-325 MG tablet, Take 1 tablet by mouth 3 (three) times daily as needed for severe pain. (Patient not taking: Reported on 06/14/2019), Disp: 15 tablet, Rfl: 0 .  losartan (COZAAR) 100 MG tablet, Take 150 mg by mouth daily. , Disp: , Rfl:  .  metoprolol succinate (TOPROL-XL) 50 MG 24 hr tablet, Take 50 mg by mouth daily., Disp: , Rfl:  .  omeprazole (PRILOSEC) 20 MG capsule, Take 20 mg by mouth 2 (two) times daily before a meal., Disp: , Rfl:   Past Medical History: Past Medical History:  Diagnosis Date  . COVID-19   . GERD (gastroesophageal reflux disease)   . Hypertension   . Idiopathic cardiomyopathy ([place]log  . Sleep apnea     Tobacco Use: Social History   Tobacco Use  Smoking Status Former Smoker  . Packs/day: 1.00  . Years: 15.00  . Pack years: 15.00  . Types: Cigarettes  . Quit date: 199. Years since quitting: 41.4  Smokeless Tobacco Never Used    Labs: Recent Review Flowsheet Data    Labs for ITP Cardiac and Pulmonary Rehab 10/26/2006   HCO3 26.4(H)   TCO2 28        Pulmonary Assessment Scores: Pulmonary Assessment Scores    Row Name 06/19/19 1113 08/23/19 0914       ADL UCSD   ADL Phase  Entry  Exit    SOB Score total  26  20    Rest  0  --    Walk  2  2    Stairs  3  3    Bath  0  0    Dress  0  0    Shop  2  1      CAT Score   CAT Score  14  11      mMRC Score   mMRC Score  2  --       UCSD: Self-administered rating of dyspnea associated with activities of daily living (ADLs) 6-point scale (0 = "not at all" to 5 = "maximal or unable to do because of breathlessness")  Scoring Scores range from 0 to 120.  Minimally important difference is 5 units  CAT: CAT can identify the health impairment of COPD patients and is better correlated with disease progression.  CAT has a scoring range of zero to 40. The CAT score is classified into four groups of low (less than 10), medium (10 - 20), high (21-30) and very high (31-40) based on the impact level  of disease on health status. A CAT score over 10 suggests significant symptoms.  A worsening CAT score could be explained by an exacerbation, poor medication adherence, poor inhaler technique, or progression of COPD or comorbid conditions.  CAT MCID is 2 points  mMRC: mMRC (Modified Medical Research Council) Dyspnea Scale is used to assess the degree of baseline functional disability in patients of respiratory disease due to dyspnea. No minimal important difference is established. A decrease in score of 1 point or greater is considered a positive change.   Pulmonary Function Assessment: Pulmonary Function Assessment - 06/19/19 1113      Breath   Shortness of Breath  Yes;Fear of Shortness of Breath;Limiting activity       Exercise Target Goals: Exercise Program Goal: Individual exercise prescription set using results from initial 6 min walk test and THRR while considering  patient's activity barriers and safety.   Exercise Prescription Goal: Initial exercise prescription builds to  30-45 minutes a day of aerobic activity, 2-3 days per week.  Home exercise guidelines will be given to patient during program as part of exercise prescription that the participant will acknowledge.  Education: Aerobic Exercise & Resistance Training: - Gives group verbal and written instruction on the various components of exercise. Focuses on aerobic and resistive training programs and the benefits of this training and how to safely progress through these programs..   Education: Exercise & Equipment Safety: - Individual verbal instruction and demonstration of equipment use and safety with use of the equipment.   Pulmonary Rehab from 06/14/2019 in [facility] Cardiac and Pulmonary Rehab  Date  06/14/19  Educator  [facility]  Instruction Review Code  1- Verbalizes Understanding      Education: Exercise Physiology & General Exercise Guidelines: - Group verbal and written instruction with models to review the exercise physiology of the cardiovascular system and associated critical values. Provides general exercise guidelines with specific guidelines to those with heart or lung disease.    Education: Flexibility, Balance, Mind/Body Relaxation: Provides group verbal/written instruction on the benefits of flexibility and balance training, including mind/body exercise modes such as yoga, pilates and tai chi.  Demonstration and skill practice provided.   Activity Barriers & Risk Stratification: Activity Barriers & Cardiac Risk Stratification - 06/19/19 1108      Activity Barriers & Cardiac Risk Stratification   Activity Barriers  Balance Concerns;Deconditioning;Muscular Weakness;Other (comment);Joint Problems    Comments  numbness in both feet, L shoulder limited ROM from lung surgery       6 Minute Walk: 6 Minute Walk    Row Name 06/19/19 1100 08/23/19 0840       6 Minute Walk   Phase  Initial  Discharge    Distance  1435 feet  1687 feet    Distance % Change  --  17.5 %    Distance Feet Change  --   252 ft    Walk Time  6 minutes  6 minutes    # of Rest Breaks  0  0    MPH  2.72  3.2    METS  3.1  3.67    RPE  11  12    Perceived Dyspnea   2  0    VO2 Peak  10.84  12.86    Symptoms  Yes (comment)  No    Comments  SOB, fatigue at end  --    Resting HR  63 bpm  52 bpm    Resting BP  108/64  106/70  Resting Oxygen Saturation   92 %  96 %    Exercise Oxygen Saturation  during 6 min walk  90 %  92 %    Max Ex. HR  103 bpm  99 bpm    Max Ex. BP  134/54  160/74    2 Minute Post BP  126/54  --      Interval HR   1 Minute HR  93  78    2 Minute HR  102  87    3 Minute HR  103  97    4 Minute HR  102  99    5 Minute HR  101  98    6 Minute HR  99  96    2 Minute Post HR  65  --    Interval Heart Rate?  Yes  Yes      Interval Oxygen   Interval Oxygen?  Yes  Yes    Baseline Oxygen Saturation %  92 %  96 %    1 Minute Oxygen Saturation %  95 %  94 %    1 Minute Liters of Oxygen  0 L Room Air  0 L    2 Minute Oxygen Saturation %  94 %  92 %    2 Minute Liters of Oxygen  0 L  0 L    3 Minute Oxygen Saturation %  92 %  92 %    3 Minute Liters of Oxygen  0 L  0 L    4 Minute Oxygen Saturation %  91 %  93 %    4 Minute Liters of Oxygen  0 L  0 L    5 Minute Oxygen Saturation %  90 %  92 %    5 Minute Liters of Oxygen  0 L  0 L    6 Minute Oxygen Saturation %  90 %  93 %    6 Minute Liters of Oxygen  0 L  0 L    2 Minute Post Oxygen Saturation %  97 %  --    2 Minute Post Liters of Oxygen  0 L  0 L      Oxygen Initial Assessment: Oxygen Initial Assessment - 06/14/19 1037      Home Oxygen   Home Oxygen Device  None    Sleep Oxygen Prescription  CPAP    Liters per minute  0    Home Exercise Oxygen Prescription  None    Home at Rest Exercise Oxygen Prescription  None    Compliance with Home Oxygen Use  Yes      Initial 6 min Walk   Oxygen Used  None      Program Oxygen Prescription   Program Oxygen Prescription  None      Intervention   Short Term Goals  To learn and  exhibit compliance with exercise, home and travel O2 prescription;To learn and understand importance of monitoring SPO2 with pulse oximeter and demonstrate accurate use of the pulse oximeter.;To learn and understand importance of maintaining oxygen saturations>88%;To learn and demonstrate proper pursed lip breathing techniques or other breathing techniques.;To learn and demonstrate proper use of respiratory medications    Long  Term Goals  Exhibits compliance with exercise, home and travel O2 prescription;Verbalizes importance of monitoring SPO2 with pulse oximeter and return demonstration;Maintenance of O2 saturations>88%;Exhibits proper breathing techniques, such as pursed lip breathing or other method taught during program session;Compliance with respiratory medication;Demonstrates proper use of  MDI's       Oxygen Re-Evaluation: Oxygen Re-Evaluation    Row Name 07/10/19 0843 08/07/19 0811 08/21/19 0826         Program Oxygen Prescription   Program Oxygen Prescription  None  None  None       Home Oxygen   Home Oxygen Device  None  None  None     Sleep Oxygen Prescription  CPAP  CPAP  CPAP     Liters per minute  0  0  0     Home Exercise Oxygen Prescription  --  None  None     Home at Rest Exercise Oxygen Prescription  None  None  None     Compliance with Home Oxygen Use  Yes  Yes  Yes       Goals/Expected Outcomes   Short Term Goals  To learn and exhibit compliance with exercise, home and travel O2 prescription;To learn and understand importance of monitoring SPO2 with pulse oximeter and demonstrate accurate use of the pulse oximeter.;To learn and understand importance of maintaining oxygen saturations>88%;To learn and demonstrate proper pursed lip breathing techniques or other breathing techniques.;To learn and demonstrate proper use of respiratory medications  To learn and exhibit compliance with exercise, home and travel O2 prescription;To learn and understand importance of monitoring  SPO2 with pulse oximeter and demonstrate accurate use of the pulse oximeter.;To learn and understand importance of maintaining oxygen saturations>88%;To learn and demonstrate proper pursed lip breathing techniques or other breathing techniques.;To learn and demonstrate proper use of respiratory medications  To learn and exhibit compliance with exercise, home and travel O2 prescription;To learn and understand importance of monitoring SPO2 with pulse oximeter and demonstrate accurate use of the pulse oximeter.;To learn and understand importance of maintaining oxygen saturations>88%;To learn and demonstrate proper pursed lip breathing techniques or other breathing techniques.;To learn and demonstrate proper use of respiratory medications     Long  Term Goals  Exhibits compliance with exercise, home and travel O2 prescription;Verbalizes importance of monitoring SPO2 with pulse oximeter and return demonstration;Maintenance of O2 saturations>88%;Exhibits proper breathing techniques, such as pursed lip breathing or other method taught during program session;Compliance with respiratory medication;Demonstrates proper use of MDI's  Exhibits compliance with exercise, home and travel O2 prescription;Verbalizes importance of monitoring SPO2 with pulse oximeter and return demonstration;Maintenance of O2 saturations>88%;Exhibits proper breathing techniques, such as pursed lip breathing or other method taught during program session;Compliance with respiratory medication;Demonstrates proper use of MDI's  Exhibits compliance with exercise, home and travel O2 prescription;Verbalizes importance of monitoring SPO2 with pulse oximeter and return demonstration;Maintenance of O2 saturations>88%;Exhibits proper breathing techniques, such as pursed lip breathing or other method taught during program session;Compliance with respiratory medication;Demonstrates proper use of MDI's     Comments  Ricky Petersen uses his CPAP at night  Ricky Petersen is doing  well at home.  He is using his CPAP and pulse oximeter at home.  He has noticed that he is no longer dropping low at home.  He is not consistent with his PLB but does not that it helps when he does use it.  He doesn't really get SOB, just fatigued.  Ricky Petersen is doing well in rehab. He is planning to graduate on Thursday.  His PLB is improving and SOB overall is better.  He is compliant with his CPAP.     Goals/Expected Outcomes  Short: continue to use CPAP as directed Long: manage oxygen  Short: Continue to work on PLB  Long: Continue to monitor oxygen  levels.  Continued compliance and use PLB for SOB.        Oxygen Discharge (Final Oxygen Re-Evaluation): Oxygen Re-Evaluation - 08/21/19 0826      Program Oxygen Prescription   Program Oxygen Prescription  None      Home Oxygen   Home Oxygen Device  None    Sleep Oxygen Prescription  CPAP    Liters per minute  0    Home Exercise Oxygen Prescription  None    Home at Rest Exercise Oxygen Prescription  None    Compliance with Home Oxygen Use  Yes      Goals/Expected Outcomes   Short Term Goals  To learn and exhibit compliance with exercise, home and travel O2 prescription;To learn and understand importance of monitoring SPO2 with pulse oximeter and demonstrate accurate use of the pulse oximeter.;To learn and understand importance of maintaining oxygen saturations>88%;To learn and demonstrate proper pursed lip breathing techniques or other breathing techniques.;To learn and demonstrate proper use of respiratory medications    Long  Term Goals  Exhibits compliance with exercise, home and travel O2 prescription;Verbalizes importance of monitoring SPO2 with pulse oximeter and return demonstration;Maintenance of O2 saturations>88%;Exhibits proper breathing techniques, such as pursed lip breathing or other method taught during program session;Compliance with respiratory medication;Demonstrates proper use of MDI's    Comments  Ricky Petersen is doing well in rehab. He  is planning to graduate on Thursday.  His PLB is improving and SOB overall is better.  He is compliant with his CPAP.    Goals/Expected Outcomes  Continued compliance and use PLB for SOB.       Initial Exercise Prescription: Initial Exercise Prescription - 06/19/19 1100      Date of Initial Exercise RX and Referring Provider   Date  06/19/19    Referring Provider  Susa Simmonds MD      Treadmill   MPH  2.7    Grade  0.5    Minutes  15    METs  3.25      Elliptical   Level  1    Speed  2.7    Minutes  15    METs  3      REL-XR   Level  3    Speed  50    Minutes  15    METs  3      Prescription Details   Frequency (times per week)  2    Duration  Progress to 30 minutes of continuous aerobic without signs/symptoms of physical distress      Intensity   THRR 40-80% of Max Heartrate  96-129    Ratings of Perceived Exertion  11-13    Perceived Dyspnea  0-4      Progression   Progression  Continue to progress workloads to maintain intensity without signs/symptoms of physical distress.      Resistance Training   Training Prescription  Yes    Weight  4 lb    Reps  10-15       Perform Capillary Blood Glucose checks as needed.  Exercise Prescription Changes: Exercise Prescription Changes    Row Name 06/19/19 1100 07/03/19 1500 07/18/19 1100 07/31/19 1500 08/13/19 1500     Response to Exercise   Blood Pressure (Admit)  108/64  124/80  110/64  100/62  128/76   Blood Pressure (Exercise)  134/54  140/80  158/76  140/70  130/88   Blood Pressure (Exit)  126/60  110/80  96/60  120/70  122/56  Heart Rate (Admit)  63 bpm  52 bpm  57 bpm  56 bpm  50 bpm   Heart Rate (Exercise)  103 bpm  88 bpm  93 bpm  97 bpm  79 bpm   Heart Rate (Exit)  64 bpm  63 bpm  69 bpm  75 bpm  61 bpm   Oxygen Saturation (Admit)  92 %  97 %  99 %  98 %  96 %   Oxygen Saturation (Exercise)  90 %  93 %  97 %  95 %  96 %   Oxygen Saturation (Exit)  96 %  97 %  97 %  96 %  98 %   Rating of Perceived  Exertion (Exercise)  _0 Perceived Dyspnea (Exercise)  _1 Symptoms  SOB, fatigue at end  --  none  none  knee pain on elliptical   Comments  walk test results  --  --  --  --   Duration  --  Progress to 30 minutes of  aerobic without signs/symptoms of physical distress  Continue with 30 min of aerobic exercise without signs/symptoms of physical distress.  Continue with 30 min of aerobic exercise without signs/symptoms of physical distress.  Continue with 30 min of aerobic exercise without signs/symptoms of physical distress.   Intensity  --  THRR unchanged  THRR unchanged  THRR unchanged  THRR unchanged     Progression   Progression  --  Continue to progress workloads to maintain intensity without signs/symptoms of physical distress.  Continue to progress workloads to maintain intensity without signs/symptoms of physical distress.  Continue to progress workloads to maintain intensity without signs/symptoms of physical distress.  Continue to progress workloads to maintain intensity without signs/symptoms of physical distress.   Average METs  --  3  4.17  4.67  3.72     Resistance Training   Training Prescription  --  Yes  Yes  Yes  Yes   Weight  --   4 lb   4 lb  4 lb  4 lb   Reps  --  10-15  10-15  10-15  10-15     Interval Training   Interval Training  --  --  No  No  No     Treadmill   MPH  --  2.8  2.8  2.7  2.8   Grade  --  0.5  1  1.5  1.5   Minutes  --  _2 METs  --  3.25  3.53  3.63  3.72     Recumbant Bike   Level  --  --  --  --  5   Minutes  --  --  --  --  15     NuStep   Level  --  --  --  --  4   Minutes  --  --  --  --  15     Elliptical   Level  --  --  2  --  2   Speed  --  --  2.7  --  2.7   Minutes  --  --  15  --  10     REL-XR   Level  --  _3 --   Speed  --  50  --  50  --  Minutes  --  _0 --   METs  --  2.8  4.8  5.7  --   Row Name 08/14/19 1200             Home Exercise Plan   Plans to  continue exercise at  [facility] (comment[facility] [employer]       Frequency  Add 1 additional day to program exercise sessions.       Initial Home Exercises Provided  08/14/19          Exercise Comments: Exercise Comments    Row Name 06/21/19 4097 08/23/19 0918         Exercise Comments  First full day of exercise!  Patient was oriented to gym and equipment including functions, settings, policies, and procedures.  Patient's individual exercise prescription and treatment plan were reviewed.  All starting workloads were established based on the results of the 6 minute walk test done at initial orientation visit.  The plan for exercise progression was also introduced and progression will be customized based on patient's performance and goals.  Ricky Petersen graduated today from  rehab with 19 sessions completed.  Details of the patient's exercise prescription and what He needs to do in order to continue the prescription and progress were discussed with patient.  Patient was given a copy of prescription and goals.  Patient verbalized understanding.  Ricky Petersen plans to continue to exercise by joining a gym near home.         Exercise Goals and Review: Exercise Goals    Row Name 06/19/19 1110             Exercise Goals   Increase Physical Activity  Yes       Intervention  Provide advice, education, support and counseling about physical activity/exercise needs.;Develop an individualized exercise prescription for aerobic and resistive training based on initial evaluation findings, risk stratification, comorbidities and participant's personal goals.       Expected Outcomes  Short Term: Attend rehab on a regular basis to increase amount of physical activity.;Long Term: Add in home exercise to make exercise part of routine and to increase amount of physical activity.;Long Term: Exercising regularly at least 3-5 days a week.       Increase Strength and Stamina  Yes       Intervention  Provide advice,  education, support and counseling about physical activity/exercise needs.;Develop an individualized exercise prescription for aerobic and resistive training based on initial evaluation findings, risk stratification, comorbidities and participant's personal goals.       Expected Outcomes  Short Term: Increase workloads from initial exercise prescription for resistance, speed, and METs.;Short Term: Perform resistance training exercises routinely during rehab and add in resistance training at home;Long Term: Improve cardiorespiratory fitness, muscular endurance and strength as measured by increased METs and functional capacity (6MWT)       Able to understand and use rate of perceived exertion (RPE) scale  Yes       Intervention  Provide education and explanation on how to use RPE scale       Expected Outcomes  Long Term:  Able to use RPE to guide intensity level when exercising independently;Short Term: Able to use RPE daily in rehab to express subjective intensity level       Able to understand and use Dyspnea scale  Yes       Intervention  Provide education and explanation on how to use Dyspnea scale  Expected Outcomes  Short Term: Able to use Dyspnea scale daily in rehab to express subjective sense of shortness of breath during exertion;Long Term: Able to use Dyspnea scale to guide intensity level when exercising independently       Knowledge and understanding of Target Heart Rate Range (THRR)  Yes       Intervention  Provide education and explanation of THRR including how the numbers were predicted and where they are located for reference       Expected Outcomes  Short Term: Able to use daily as guideline for intensity in rehab;Long Term: Able to use THRR to govern intensity when exercising independently;Short Term: Able to state/look up THRR       Able to check pulse independently  Yes       Intervention  Review the importance of being able to check your own pulse for safety during independent  exercise;Provide education and demonstration on how to check pulse in carotid and radial arteries.       Expected Outcomes  Short Term: Able to explain why pulse checking is important during independent exercise;Long Term: Able to check pulse independently and accurately       Understanding of Exercise Prescription  Yes       Intervention  Provide education, explanation, and written materials on patient's individual exercise prescription       Expected Outcomes  Short Term: Able to explain program exercise prescription;Long Term: Able to explain home exercise prescription to exercise independently          Exercise Goals Re-Evaluation : Exercise Goals Re-Evaluation    Row Name 06/21/19 5726 07/03/19 1556 07/10/19 0840 07/18/19 1134 07/31/19 1544     Exercise Goal Re-Evaluation   Exercise Goals Review  Able to understand and use rate of perceived exertion (RPE) scale;Knowledge and understanding of Target Heart Rate Range (THRR);Able to check pulse independently;Understanding of Exercise Prescription  Increase Physical Activity;Increase Strength and Stamina;Able to understand and use rate of perceived exertion (RPE) scale;Able to understand and use Dyspnea scale;Knowledge and understanding of Target Heart Rate Range (THRR);Able to check pulse independently;Understanding of Exercise Prescription  Increase Physical Activity;Increase Strength and Stamina;Able to understand and use rate of perceived exertion (RPE) scale;Able to understand and use Dyspnea scale;Knowledge and understanding of Target Heart Rate Range (THRR);Able to check pulse independently;Understanding of Exercise Prescription  Increase Physical Activity;Increase Strength and Stamina;Understanding of Exercise Prescription  Increase Physical Activity;Increase Strength and Stamina;Able to understand and use rate of perceived exertion (RPE) scale;Able to understand and use Dyspnea scale;Knowledge and understanding of Target Heart Rate Range  (THRR);Able to check pulse independently;Understanding of Exercise Prescription   Comments  Reviewed RPE scale, THR and program prescription with pt today.  Pt voiced understanding and was given a copy of goals to take home.  Ricky Petersen tolerates exercise well.  Oxygen stayed above 88%.  Staff will monitor progress.  Ricky Petersen lost 30+ lb during his Covid event.  he wants to be able to do yard work and regain strength and stamina.  He did 2 sets of 6 min on elliptical  Ricky Petersen is doing well in rehab.  He is now up to level 6 on the XR and 2.8 mph on the treadmill.  We will continue to monitor his progress.  Ricky Petersen attends consistently and his O2 sats have stayed in the high 90s.   Expected Outcomes  Short: Use RPE daily to regulate intensity. Long: Follow program prescription in THR.  Short: attend consistently Long: build overall  stamina  Short:  be able to do elliptical for 15 min straight  Long: build overall stamina  Short: Continue to add to workloads  Long: Continue to improve stamina.  Short: continue to increase workloads Long:improve overall MET level   Row Name 08/07/19 0807 08/13/19 1551 08/14/19 1246 08/21/19 0821       Exercise Goal Re-Evaluation   Exercise Goals Review  Increase Physical Activity;Increase Strength and Stamina;Understanding of Exercise Prescription  Increase Physical Activity;Increase Strength and Stamina;Understanding of Exercise Prescription  Increase Physical Activity;Increase Strength and Stamina;Able to understand and use rate of perceived exertion (RPE) scale;Able to understand and use Dyspnea scale;Knowledge and understanding of Target Heart Rate Range (THRR);Able to check pulse independently;Understanding of Exercise Prescription  Increase Physical Activity;Increase Strength and Stamina;Understanding of Exercise Prescription    Comments  Ricky Petersen is doing well in rehab.  He is staying active at home by staying busy in the yard.  He has mentioned that the ellipitcal bothers his knees and  then wants to spend the day in his recliner.  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  We will look at rotating off the ellipitcal for next time.  He has not noticed much difference in his strength and stamina yet. They are walking more at other places, but no difference yet.  Ricky Petersen is doing well in rehab.  We have moved him off of the elliptical because of his knees and they are beginning to feel better again.  He is on level 4 for the NuStep and level 5 on the recumbent bike now.  We wil lcontinue to monitor his progress.  Reviewed home exercise with pt today.  Pt plans to join [employer] for exercise.  Reviewed THR, pulse, RPE, sign and symptoms, NTG use, and when to call 911 or MD.  Also discussed weather considerations and indoor options.  Pt voiced understanding.  Ricky Petersen is doing well in rehab.  He is walking at home some.  He has not joined the gym yet but is planning to maybe join the [employer].  He starts to travel more in June and July and would like to graduate on Thursday.    Expected Outcomes  Short: Rotate off ellipitical  Long: Continue to improve stamina.  Short: Increase speed on the elliptical  Long: Continue to improve stamina.  Short: exercise one day per week outside program sessions  Long:  improve overall MET level  Improve post 6MWT and join gym to continue to exercise.       Discharge Exercise Prescription (Final Exercise Prescription Changes): Exercise Prescription Changes - 08/14/19 1200      Home Exercise Plan   Plans to continue exercise at  [facility] (comment)   [facility]   Frequency  Add 1 additional day to program exercise sessions.    Initial Home Exercises Provided  08/14/19       Nutrition:  Target Goals: Understanding of nutrition guidelines, daily intake of sodium <1555m, cholesterol <2031m calories 30% from fat and 7% or less from saturated fats, daily to have 5 or more servings of fruits and vegetables.  Education:  Controlling Sodium/Reading Food Labels -Group verbal and written material supporting the discussion of sodium use in heart healthy nutrition. Review and explanation with models, verbal and written materials for utilization of the food label.   Education: General Nutrition Guidelines/Fats and Fiber: -Group instruction provided by verbal, written material, models and posters to present the general guidelines  for heart healthy nutrition. Gives an explanation and review of dietary fats and fiber.   Biometrics: Pre Biometrics - 06/19/19 1112      Pre Biometrics   Height  6' 3.6" (1.92 m)    Weight  223 lb (101.2 kg)    BMI (Calculated)  27.44    Single Leg Stand  2.34 seconds        Nutrition Therapy Plan and Nutrition Goals: Nutrition Therapy & Goals - 07/04/19 1201      Nutrition Therapy   Diet  Low Na, HH    Protein (specify units)  85-90g    Whole Grain Foods  5 servings    Saturated Fats  12 max. grams    Fruits and Vegetables  5 servings/day    Sodium  1.5 grams      Personal Nutrition Goals   Nutrition Goal  ST: review paperwork and go over short term goals LT: Increase lung capacity, increased flexibilty, regain muscle lost when in hospital    Comments  Pt reports there is not such thing as a normal day of eating. Cereal with fruit (3xdays) and yogurt, other days go out and get eggs with grits or hashbrowns, and sometimes will get a biscuit. L: if have a big breakfast may not have lunch - typically fast food. D: wife home cooking  or going out to eat on the days when they go see grandaughter play. Discussed general healthy eating, pulmonary MNT. Pt would like to review information.      Intervention Plan   Intervention  Prescribe, educate and counsel regarding individualized specific dietary modifications aiming towards targeted core components such as weight, hypertension, lipid management, diabetes, heart failure and other comorbidities.;Nutrition handout(s) given to patient.     Expected Outcomes  Short Term Goal: Understand basic principles of dietary content, such as calories, fat, sodium, cholesterol and nutrients.;Short Term Goal: A plan has been developed with personal nutrition goals set during dietitian appointment.;Long Term Goal: Adherence to prescribed nutrition plan.       Nutrition Assessments: Nutrition Assessments - 08/23/19 0916      MEDFICTS Scores   Pre Score  39    Post Score  52    Score Difference  13       MEDIFICTS Score Key:          ?70 Need to make dietary changes          40-70 Heart Healthy Diet         ? 40 Therapeutic Level Cholesterol Diet  Nutrition Goals Re-Evaluation: Nutrition Goals Re-Evaluation    Row Name 08/21/19 0827             Goals   Comment  Ricky Petersen is doing fairly with his diet.  He has improved but does admit to cheating some.  He will continue to make healthier choices.       Expected Outcome  Continue to follow changes made in rehab.          Nutrition Goals Discharge (Final Nutrition Goals Re-Evaluation): Nutrition Goals Re-Evaluation - 08/21/19 0827      Goals   Comment  Ricky Petersen is doing fairly with his diet.  He has improved but does admit to cheating some.  He will continue to make healthier choices.    Expected Outcome  Continue to follow changes made in rehab.       Psychosocial: Target Goals: Acknowledge presence or absence of significant depression and/or stress, maximize coping skills, provide  positive support system. Participant is able to verbalize types and ability to use techniques and skills needed for reducing stress and depression.   Education: Depression - Provides group verbal and written instruction on the correlation between heart/lung disease and depressed mood, treatment options, and the stigmas associated with seeking treatment.   Education: Sleep Hygiene -Provides group verbal and written instruction about how sleep can affect your health.  Define sleep hygiene, discuss  sleep cycles and impact of sleep habits. Review good sleep hygiene tips.    Education: Stress and Anxiety: - Provides group verbal and written instruction about the health risks of elevated stress and causes of high stress.  Discuss the correlation between heart/lung disease and anxiety and treatment options. Review healthy ways to manage with stress and anxiety.   Initial Review & Psychosocial Screening: Initial Psych Review & Screening - 06/14/19 1038      Initial Review   Current issues with  Current Sleep Concerns;Current Stress Concerns    Source of Stress Concerns  Chronic Illness    Comments  Sometimes he cannot sleep well. He wakes up with aches and pains at times.      Family Dynamics   Good Support System?  Yes    Comments  He can look to his wife, daughters and 4 grandchildren for support.      Barriers   Psychosocial barriers to participate in program  The patient should benefit from training in stress management and relaxation.      Screening Interventions   Interventions  Encouraged to exercise;To provide support and resources with identified psychosocial needs;Provide feedback about the scores to participant    Expected Outcomes  Short Term goal: Utilizing psychosocial counselor, staff and physician to assist with identification of specific Stressors or current issues interfering with healing process. Setting desired goal for each stressor or current issue identified.;Long Term Goal: Stressors or current issues are controlled or eliminated.;Short Term goal: Identification and review with participant of any Quality of Life or Depression concerns found by scoring the questionnaire.;Long Term goal: The participant improves quality of Life and PHQ9 Scores as seen by post scores and/or verbalization of changes       Quality of Life Scores:  Scores of 19 and below usually indicate a poorer quality of life in these areas.  A difference of  2-3 points is a clinically meaningful  difference.  A difference of 2-3 points in the total score of the Quality of Life Index has been associated with significant improvement in overall quality of life, self-image, physical symptoms, and general health in studies assessing change in quality of life.  PHQ-9: Recent Review Flowsheet Data    Depression screen [facility] 2/9 08/23/2019 06/19/2019   Decreased Interest 1 0   Down, Depressed, Hopeless 0 0   PHQ - 2 Score 1 0   Altered sleeping 1 1   Tired, decreased energy 1 0   Change in appetite 1 1   Feeling bad or failure about yourself  0 0   Trouble concentrating 0 0   Moving slowly or fidgety/restless 0 0   Suicidal thoughts 0 0   PHQ-9 Score 4 2   Difficult doing work/chores Not difficult at all Not difficult at all     Interpretation of Total Score  Total Score Depression Severity:  1-4 = Minimal depression, 5-9 = Mild depression, 10-14 = Moderate depression, 15-19 = Moderately severe depression, 20-27 = Severe depression   Psychosocial Evaluation and Intervention: Psychosocial Evaluation -  08/21/19 0829      Discharge Psychosocial Assessment & Intervention   Comments  Ricky Petersen is going to graduate on Thursday early as he will start to travel more for the summer with is grandson.  He has enjoyed getting into the exercise routine and learning what is helpful and making him feel better.  He is looking forward to travelling again!  He has enjoyed his overall experience.       Psychosocial Re-Evaluation: Psychosocial Re-Evaluation    Row Name 07/10/19 [phone] 08/07/19 0816           Psychosocial Re-Evaluation   Current issues with  Current Stress Concerns;Current Sleep Concerns  Current Stress Concerns;Current Sleep Concerns      Comments  Ricky Petersen wakes up a number of times during the night.  His sleep "could be better".  He does use his CPAP.  He doesnt have any particular stress.  Ricky Petersen is doing well in rehab. He feels that he is sleeping better, but still has nights with multiple  wake ups.  He has a granddaughter getting ready to play in [facility] and they will be traveling for it. She is pitcher and undeafeated, she already has a full ride to [facility].      Expected Outcomes  Short: continue to use CPAP as directed Long: develop better sleep patterns  Short: Enjoy granddaughter's championship series  Long: Continue to work on sleep.      Interventions  --  Encouraged to attend Pulmonary Rehabilitation for the exercise      Continue Psychosocial Services   --  Follow up required by staff         Psychosocial Discharge (Final Psychosocial Re-Evaluation): Psychosocial Re-Evaluation - 08/07/19 0816      Psychosocial Re-Evaluation   Current issues with  Current Stress Concerns;Current Sleep Concerns    Comments  Ricky Petersen is doing well in rehab. He feels that he is sleeping better, but still has nights with multiple wake ups.  He has a granddaughter getting ready to play in [facility] and they will be traveling for it. She is pitcher and undeafeated, she already has a full ride to [facility].    Expected Outcomes  Short: Enjoy granddaughter's championship series  Long: Continue to work on sleep.    Interventions  Encouraged to attend Pulmonary Rehabilitation for the exercise    Continue Psychosocial Services   Follow up required by staff       Education: Education Goals: Education classes will be provided on a weekly basis, covering required topics. Participant will state understanding/return demonstration of topics presented.  Learning Barriers/Preferences: Learning Barriers/Preferences - 06/14/19 1039      Learning Barriers/Preferences   Learning Barriers  None    Learning Preferences  None       General Pulmonary Education Topics:  Infection Prevention: - Provides verbal and written material to individual with discussion of infection control including proper hand washing and proper equipment cleaning during exercise session.   Pulmonary Rehab from 06/14/2019  in [facility] Cardiac and Pulmonary Rehab  Date  06/14/19  Educator  [facility]  Instruction Review Code  1- Verbalizes Understanding      Falls Prevention: - Provides verbal and written material to individual with discussion of falls prevention and safety.   Pulmonary Rehab from 06/14/2019 in [facility] Card[facility] Pul[facility] Rehab  Date  06/14/19  Educator  [facility]  Instruction Review Code  1- Verbalizes Understanding      Chronic Lung Diseases: - Group verbal and  written instruction to review updates, respiratory medications, advancements in procedures and treatments. Discuss use of supplemental oxygen including available portable oxygen systems, continuous and intermittent flow rates, concentrators, personal use and safety guidelines. Review proper use of inhaler and spacers. Provide informative websites for self-education.    Energy Conservation: - Provide group verbal and written instruction for methods to conserve energy, plan and organize activities. Instruct on pacing techniques, use of adaptive equipment and posture/positioning to relieve shortness of breath.   Triggers and Exacerbations: - Group verbal and written instruction to review types of environmental triggers and ways to prevent exacerbations. Discuss weather changes, air quality and the benefits of nasal washing. Review warning signs and symptoms to help prevent infections. Discuss techniques for effective airway clearance, coughing, and vibrations.   AED/CPR: - Group verbal and written instruction with the use of models to demonstrate the basic use of the AED with the basic ABC's of resuscitation.   Anatomy and Physiology of the Lungs: - Group verbal and written instruction with the use of models to provide basic lung anatomy and physiology related to function, structure and complications of lung disease.   Anatomy & Physiology of the Heart: - Group verbal and written instruction and models provide basic cardiac anatomy and physiology,  with the coronary electrical and arterial systems. Review of Valvular disease and Heart Failure   Cardiac Medications: - Group verbal and written instruction to review commonly prescribed medications for heart disease. Reviews the medication, class of the drug, and side effects.   Other: -Provides group and verbal instruction on various topics (see comments)   Knowledge Questionnaire Score: Knowledge Questionnaire Score - 08/23/19 0916      Knowledge Questionnaire Score   Pre Score  17/18 Education Focus: O2 safety    Post Score  15/18  correct responses were reviewed with Ricky Petersen and he verbalized understanding        Core Components/Risk Factors/Patient Goals at Admission: Personal Goals and Risk Factors at Admission - 06/19/19 1111      Core Components/Risk Factors/Patient Goals on Admission    Weight Management  Yes;Weight Loss    Intervention  Weight Management: Develop a combined nutrition and exercise program designed to reach desired caloric intake, while maintaining appropriate intake of nutrient and fiber, sodium and fats, and appropriate energy expenditure required for the weight goal.;Weight Management: Provide education and appropriate resources to help participant work on and attain dietary goals.    Admit Weight  223 lb (101.2 kg)    Goal Weight: Short Term  218 lb (98.9 kg)    Goal Weight: Long Term  208 lb (94.3 kg)    Expected Outcomes  Short Term: Continue to assess and modify interventions until short term weight is achieved;Long Term: Adherence to nutrition and physical activity/exercise program aimed toward attainment of established weight goal;Weight Loss: Understanding of general recommendations for a balanced deficit meal plan, which promotes 1-2 lb weight loss per week and includes a negative energy balance of [phone] kcal/d;Understanding recommendations for meals to include 15-35% energy as protein, 25-35% energy from fat, 35-60% energy from carbohydrates, less  than 240m of dietary cholesterol, 20-35 gm of total fiber daily;Understanding of distribution of calorie intake throughout the day with the consumption of 4-5 meals/snacks    Improve shortness of breath with ADL's  Yes    Intervention  Provide education, individualized exercise plan and daily activity instruction to help decrease symptoms of SOB with activities of daily living.    Expected Outcomes  Short Term: Improve cardiorespiratory fitness to achieve a reduction of symptoms when performing ADLs;Long Term: Be able to perform more ADLs without symptoms or delay the onset of symptoms    Heart Failure  Yes    Intervention  Provide a combined exercise and nutrition program that is supplemented with education, support and counseling about heart failure. Directed toward relieving symptoms such as shortness of breath, decreased exercise tolerance, and extremity edema.    Expected Outcomes  Improve functional capacity of life;Short term: Attendance in program 2-3 days a week with increased exercise capacity. Reported lower sodium intake. Reported increased fruit and vegetable intake. Reports medication compliance.;Short term: Daily weights obtained and reported for increase. Utilizing diuretic protocols set by physician.;Long term: Adoption of self-care skills and reduction of barriers for early signs and symptoms recognition and intervention leading to self-care maintenance.    Hypertension  Yes    Intervention  Provide education on lifestyle modifcations including regular physical activity/exercise, weight management, moderate sodium restriction and increased consumption of fresh fruit, vegetables, and low fat dairy, alcohol moderation, and smoking cessation.;Monitor prescription use compliance.    Expected Outcomes  Short Term: Continued assessment and intervention until BP is < 140/46m HG in hypertensive participants. < 130/835mHG in hypertensive participants with diabetes, heart failure or chronic  kidney disease.;Long Term: Maintenance of blood pressure at goal levels.       Education:Diabetes - Individual verbal and written instruction to review signs/symptoms of diabetes, desired ranges of glucose level fasting, after meals and with exercise. Acknowledge that pre and post exercise glucose checks will be done for 3 sessions at entry of program.   Education: Know Your Numbers and Risk Factors: -Group verbal and written instruction about important numbers in your health.  Discussion of what are risk factors and how they play a role in the disease process.  Review of Cholesterol, Blood Pressure, Diabetes, and BMI and the role they play in your overall health.   Core Components/Risk Factors/Patient Goals Review:  Goals and Risk Factor Review    Row Name 07/10/19 0834 08/07/19 0813 08/21/19 0827         Core Components/Risk Factors/Patient Goals Review   Personal Goals Review  Weight Management/Obesity;Develop more efficient breathing techniques such as purse lipped breathing and diaphragmatic breathing and practicing self-pacing with activity.;Improve shortness of breath with ADL's  Weight Management/Obesity;Improve shortness of breath with ADL's;Heart Failure  Weight Management/Obesity;Improve shortness of breath with ADL's;Heart Failure     Review  Ricky Petersen noticed his walking is better - he can do stuff longer than he could before.  The elliptical makes him tired - we discussed the long term gains from exercise.  He lost 30+ lb during Covid and had a lung surgery.  He would like to be able to do yard work.  He takes meds as directed.  Ricky Petersen doing well in rehab.  His breathing is doing well, he just notes that he gets tired. His weight has been staying steady.    Blood pressures have been good and he continues to check it at home.  He denies symptoms of heart failure currently.  He does have some feet swelling on occasion.  He has let his doctor know a little bit about it.  Ricky Petersen done  well in rehab.  His weigth has stayed steady but he would like to lose some more but maintain his strength.  He has not had any heart failure symptoms.  He is uses his zones as  to when to call doctor.  He is now in the habit of checking his pressure regularly as well.     Expected Outcomes  Short:  continue to attend consistently Long:  manage risk factors and regain strength  Short: Continue to watch heart failure symptoms  Long; Continue to work toward weight loss.  Continued management of heart failure and risk factors.        Core Components/Risk Factors/Patient Goals at Discharge (Final Review):  Goals and Risk Factor Review - 08/21/19 0827      Core Components/Risk Factors/Patient Goals Review   Personal Goals Review  Weight Management/Obesity;Improve shortness of breath with ADL's;Heart Failure    Review  Ricky Petersen has done well in rehab.  His weigth has stayed steady but he would like to lose some more but maintain his strength.  He has not had any heart failure symptoms.  He is uses his zones as to when to call doctor.  He is now in the habit of checking his pressure regularly as well.    Expected Outcomes  Continued management of heart failure and risk factors.       ITP Comments: ITP Comments    Row Name 06/14/19 1045 06/19/19 1100 06/20/19 0603 06/21/19 0821 07/04/19 1217   ITP Comments  Virtual Orientation performed. Patient informed when to come in for RD and EP orientation. Diagnosis can be found in [facility] 06/01/2019.  Completed 6MWT and gym orientation.  Initial ITP created and sent for review to Dr. Emily Filbert, Medical Director.  30 day chart review completed. ITP sent to Dr Zachery Dakins Medical Director, for review,changes as needed and signature. Continue with ITP if no changes requested New to program  First full day of exercise!  Patient was oriented to gym and equipment including functions, settings, policies, and procedures.  Patient's individual exercise prescription and  treatment plan were reviewed.  All starting workloads were established based on the results of the 6 minute walk test done at initial orientation visit.  The plan for exercise progression was also introduced and progression will be customized based on patient's performance and goals.  Completed Initial RD Evaluation   Row Name 07/18/19 0531 08/15/19 0540 08/23/19 0917       ITP Comments  30 Day review completed. Medical Director review done, changes made as directed,and approval shown by signature of Market researcher.  30 Day review completed. ITP review done, changes made as directed,and approval shown by signature of  Scientist, research (life sciences).  Ricky Petersen graduated today from  rehab with 19 sessions completed.  Details of the patient's exercise prescription and what He needs to do in order to continue the prescription and progress were discussed with patient.  Patient was given a copy of prescription and goals.  Patient verbalized understanding.  Ricky Petersen plans to continue to exercise by joining a gym near home.        Comments: Discharge ITP

## 2019-08-23 NOTE — Progress Notes (Signed)
Daily Session Note  Patient Details  Name: Ricky Petersen MRN: [id number] Date of Birth: 01/10/1946 Referring Provider:     Pulmonary Rehab from 06/19/2019 in [facility] Cardiac [facility] Pul[facility]  Referring Provider  Susa Simmonds MD      Encounter Date: 08/23/2019  Check In: Session Check In - 08/23/19 0827      Check-In   Supervising physician immediately available to respond to emergencies  See telemetry face sheet for immediately available ER MD    Location  [facility]-Cardiac & Pulmonary Rehab    Staff Present  Hope Budds RDN, Rowe Pavy, BA, ACSM CEP, Exercise Physiologist;Jessica Luan Pulling, MA, RCEP, CCRP, CCET    Virtual Visit  No    Medication changes reported      No    Fall or balance concerns reported     No    Warm-up and Cool-down  Performed on first and last piece of equipment    Resistance Training Performed  Yes    VAD Patient?  No    PAD/SET Patient?  No      Pain Assessment   Currently in Pain?  No/denies          Social History   Tobacco Use  Smoking Status Former Smoker  . Packs/day: 1.00  . Years: 15.00  . Pack years: 15.00  . Types: Cigarettes  . Quit date: 52  . Years since quitting: 41.4  Smokeless Tobacco Never Used    Goals Met:  Proper associated with RPD/PD & O2 Sat Independence with exercise equipment Exercise tolerated well No report of cardiac concerns or symptoms  Goals Unmet:  Not Applicable  Comments: Pt able to follow exercise prescription today without complaint.  Will continue to monitor for progression.   [name] graduated today from  rehab with 19 sessions completed.  Details of the patient's exercise prescription and what He needs to do in order to continue the prescription and progress were discussed with patient.  Patient was given a copy of prescription and goals.  Patient verbalized understanding.  [name] plans to continue to exercise by joining a gym near home.   Dr. Emily Filbert is Medical Director for  [place] [facility].

## 2019-12-12 ENCOUNTER — Other Ambulatory Visit: Admission: RE | Admit: 2019-12-12 | Payer: Medicare Other | Source: Ambulatory Visit

## 2019-12-27 ENCOUNTER — Other Ambulatory Visit
Admission: RE | Admit: 2019-12-27 | Discharge: 2019-12-27 | Disposition: A | Discharge status: Home / Self Care | Payer: Medicare Other | Source: Ambulatory Visit | Attending: Gastroenterology | Admitting: Gastroenterology

## 2019-12-27 ENCOUNTER — Other Ambulatory Visit: Payer: Self-pay

## 2019-12-27 DIAGNOSIS — Z20822 Contact with and (suspected) exposure to covid-19: Secondary | ICD-10-CM | POA: Insufficient documentation

## 2019-12-27 DIAGNOSIS — Z01812 Encounter for preprocedural laboratory examination: Secondary | ICD-10-CM | POA: Insufficient documentation

## 2019-12-27 LAB — SARS CORONAVIRUS 2 (TAT 6-24 HRS): SARS Coronavirus 2: NEGATIVE

## 2019-12-31 ENCOUNTER — Other Ambulatory Visit: Payer: Self-pay

## 2019-12-31 ENCOUNTER — Ambulatory Visit: Payer: Medicare Other | Admitting: Anesthesiology

## 2019-12-31 ENCOUNTER — Encounter
Admission: RE | Disposition: A | Discharge status: Home / Self Care | Payer: Self-pay | Source: Home / Self Care | Attending: Gastroenterology

## 2019-12-31 ENCOUNTER — Ambulatory Visit
Admission: RE | Admit: 2019-12-31 | Discharge: 2019-12-31 | Disposition: A | Discharge status: Home / Self Care | Payer: Medicare Other | Attending: Gastroenterology | Admitting: Gastroenterology

## 2019-12-31 ENCOUNTER — Encounter: Payer: Self-pay | Admitting: *Deleted

## 2019-12-31 DIAGNOSIS — I429 Cardiomyopathy, unspecified: Secondary | ICD-10-CM | POA: Insufficient documentation

## 2019-12-31 DIAGNOSIS — Z86010 Personal history of colonic polyps: Secondary | ICD-10-CM | POA: Insufficient documentation

## 2019-12-31 DIAGNOSIS — K219 Gastro-esophageal reflux disease without esophagitis: Secondary | ICD-10-CM | POA: Insufficient documentation

## 2019-12-31 DIAGNOSIS — Z7982 Long term (current) use of aspirin: Secondary | ICD-10-CM | POA: Diagnosis not present

## 2019-12-31 DIAGNOSIS — Z8616 Personal history of COVID-19: Secondary | ICD-10-CM | POA: Insufficient documentation

## 2019-12-31 DIAGNOSIS — I10 Essential (primary) hypertension: Secondary | ICD-10-CM | POA: Insufficient documentation

## 2019-12-31 DIAGNOSIS — G4733 Obstructive sleep apnea (adult) (pediatric): Secondary | ICD-10-CM | POA: Insufficient documentation

## 2019-12-31 DIAGNOSIS — E782 Mixed hyperlipidemia: Secondary | ICD-10-CM | POA: Diagnosis not present

## 2019-12-31 DIAGNOSIS — Z79899 Other long term (current) drug therapy: Secondary | ICD-10-CM | POA: Diagnosis not present

## 2019-12-31 DIAGNOSIS — Z1211 Encounter for screening for malignant neoplasm of colon: Secondary | ICD-10-CM | POA: Insufficient documentation

## 2019-12-31 DIAGNOSIS — K640 First degree hemorrhoids: Secondary | ICD-10-CM | POA: Diagnosis not present

## 2019-12-31 HISTORY — DX: Cardiac arrhythmia, unspecified: I49.9

## 2019-12-31 HISTORY — PX: COLONOSCOPY: SHX5424

## 2019-12-31 HISTORY — DX: Anemia, unspecified: D64.9

## 2019-12-31 HISTORY — DX: Obstructive sleep apnea (adult) (pediatric): G47.33

## 2019-12-31 HISTORY — DX: Cardiac murmur, unspecified: R01.1

## 2019-12-31 HISTORY — DX: Mixed hyperlipidemia: E78.2

## 2019-12-31 SURGERY — COLONOSCOPY
Anesthesia: General

## 2019-12-31 MED ORDER — GLYCOPYRROLATE 0.2 MG/ML IJ SOLN
INTRAMUSCULAR | Status: DC | PRN
  Administered 2019-12-31: .2 mg via INTRAVENOUS

## 2019-12-31 MED ORDER — SODIUM CHLORIDE 0.9 % IV SOLN: INTRAVENOUS | Status: DC

## 2019-12-31 MED ORDER — LIDOCAINE HCL (PF) 2 % IJ SOLN
INTRAMUSCULAR | Status: AC
  Filled 2019-12-31: qty 5

## 2019-12-31 MED ORDER — PROPOFOL 500 MG/50ML IV EMUL
INTRAVENOUS | Status: AC
  Filled 2019-12-31: qty 50

## 2019-12-31 MED ORDER — LIDOCAINE HCL (CARDIAC) PF 100 MG/5ML IV SOSY
PREFILLED_SYRINGE | INTRAVENOUS | Status: DC | PRN
  Administered 2019-12-31: 50 mg via INTRAVENOUS

## 2019-12-31 MED ORDER — PROPOFOL 500 MG/50ML IV EMUL
INTRAVENOUS | Status: DC | PRN
  Administered 2019-12-31: 125 ug/kg/min via INTRAVENOUS

## 2019-12-31 MED ORDER — PROPOFOL 10 MG/ML IV BOLUS
INTRAVENOUS | Status: DC | PRN
  Administered 2019-12-31: 80 mg via INTRAVENOUS

## 2019-12-31 NOTE — Anesthesia Postprocedure Evaluation (Signed)
Anesthesia Post Note  Patient: Ricky Petersen  Procedure(s) Performed: COLONOSCOPY (N/A )  Patient location during evaluation: Endoscopy Anesthesia Type: General Level of consciousness: awake and alert and oriented Pain management: pain level controlled Vital Signs Assessment: post-procedure vital signs reviewed and stable Respiratory status: spontaneous breathing, nonlabored ventilation and respiratory function stable Cardiovascular status: blood pressure returned to baseline and stable Postop Assessment: no signs of nausea or vomiting Anesthetic complications: no   No complications documented.   Last Vitals:  Vitals:   12/31/19 1142 12/31/19 1152  BP: 107/75   Pulse: 65   Resp: (!) 21   Temp: (!) 36.1 C   SpO2: 100% 99%    Last Pain:  Vitals:   12/31/19 1212  TempSrc:   PainSc: 0-No pain                 Lindy Pennisi

## 2019-12-31 NOTE — Interval H&P Note (Signed)
History and Physical Interval Note:  12/31/2019 11:08 AM  Ricky Petersen  has presented today for surgery, with the diagnosis of PERSONAL HX.OF COLON POLYPS.  The various methods of treatment have been discussed with the patient and family. After consideration of risks, benefits and other options for treatment, the patient has consented to  Procedure(s): COLONOSCOPY (N/A) as a surgical intervention.  The patient's history has been reviewed, patient examined, no change in status, stable for surgery.  I have reviewed the patient's chart and labs.  Questions were answered to the patient's satisfaction.     [name]  Ok to proceed with colonoscopy.

## 2019-12-31 NOTE — Transfer of Care (Signed)
Immediate Anesthesia Transfer of Care Note  Patient: Jais Demir Ulloa  Procedure(s) Performed: COLONOSCOPY (N/A )  Patient Location: Endoscopy Unit  Anesthesia Type:General  Level of Consciousness: awake, alert  and oriented  Airway & Oxygen Therapy: Patient Spontanous Breathing and Patient connected to face mask oxygen  Post-op Assessment: Report given to RN and Post -op Vital signs reviewed and stable  Post vital signs: Reviewed and stable  Last Vitals:  Vitals Value Taken Time  BP 107/75 12/31/19 1142  Temp    Pulse 61 12/31/19 1145  Resp 18 12/31/19 1145  SpO2 99 % 12/31/19 1145  Vitals shown include unvalidated device data.  Last Pain:  Vitals:   12/31/19 0954  TempSrc: Temporal  PainSc: 0-No pain         Complications: No complications documented.

## 2019-12-31 NOTE — H&P (Signed)
Outpatient short stay form Pre-procedure 12/31/2019 11:06 AM Raylene Miyamoto MD, MPH  Primary Physician: Dr. Ginette Pitman  Reason for visit:  Surveillance  History of present illness:   74 y/o gentleman with history of small TA's here for surveillance colonoscopy. No abdominal surgeries, no blood thinners, no family history of GI malignancies.    Current Facility-Administered Medications:  .  0.9 %  sodium chloride infusion, , Intravenous, Continuous, Sereena Marando, Hilton Cork, MD, Last Rate: 20 mL/hr at 12/31/19 1059, Continued from Pre-op at 12/31/19 1059  Facility-Administered Medications Ordered in Other Encounters:  .  glycopyrrolate (ROBINUL) injection, , Intravenous, Anesthesia Intra-op, Natasha Mead, CRNA, 0.2 mg at 12/31/19 1103  Medications Prior to Admission  Medication Sig Dispense Refill Last Dose  . aspirin EC 81 MG tablet Take 81 mg by mouth daily.   12/30/2019 at Unknown time  . fluticasone (FLONASE) 50 MCG/ACT nasal spray Place 2 sprays into both nostrils daily.   Past Month at Unknown time  . hydrochlorothiazide (HYDRODIURIL) 25 MG tablet Take 25 mg by mouth daily.   12/30/2019 at Unknown time  . levocetirizine (XYZAL) 5 MG tablet Take 5 mg by mouth every evening.   12/30/2019 at Unknown time  . losartan (COZAAR) 100 MG tablet Take 150 mg by mouth daily.    12/30/2019 at Unknown time  . metoprolol succinate (TOPROL-XL) 50 MG 24 hr tablet Take 50 mg by mouth daily.   12/30/2019 at Unknown time  . omeprazole (PRILOSEC) 20 MG capsule Take 20 mg by mouth 2 (two) times daily before a meal.   12/30/2019 at Unknown time  . sotalol (BETAPACE) 120 MG tablet Take 120 mg by mouth 2 (two) times daily.   12/30/2019 at Unknown time  . vitamin B-12 (CYANOCOBALAMIN) 500 MCG tablet Take 500 mcg by mouth daily.   12/30/2019 at Unknown time  . cefdinir (OMNICEF) 300 MG capsule Take 300 mg by mouth 2 (two) times daily. (Patient not taking: Reported on 12/31/2019)   Not Taking at Unknown time  . Eliquis DVT/PE  Starter Pack (ELIQUIS STARTER PACK) 5 MG TABS Take as directed on package: start with two-5mg  tablets twice daily for 7 days. On day 8, switch to one-5mg  tablet twice daily. (Patient not taking: Reported on 12/31/2019) 1 each 0 Not Taking at Unknown time  . HYDROcodone-acetaminophen (NORCO/VICODIN) 5-325 MG tablet Take 1 tablet by mouth 3 (three) times daily as needed for severe pain. (Patient not taking: Reported on 06/14/2019) 15 tablet 0 Not Taking at Unknown time     No Known Allergies   Past Medical History:  Diagnosis Date  . Anemia   . COVID-19   . Dysrhythmia   . GERD (gastroesophageal reflux disease)   . Heart murmur   . Hypertension   . Idiopathic cardiomyopathy ([place])   . Mixed hyperlipidemia   . OSA (obstructive sleep apnea)   . Sleep apnea     Review of systems:  Otherwise negative.    Physical Exam  Gen: Alert, oriented. Appears stated age.  HEENT: [place]/AT. PERRLA. Lungs: No respiratory distress Abd: soft, benign, no masses. Ext: No edema.     Planned procedures: Proceed with colonoscopy. The patient understands the nature of the planned procedure, indications, risks, alternatives and potential complications including but not limited to bleeding, infection, perforation, damage to internal organs and possible oversedation/side effects from anesthesia. The patient agrees and gives consent to proceed.  Please refer to procedure notes for findings, recommendations and patient disposition/instructions.     Raylene Miyamoto  MD, MPH Gastroenterology 12/31/2019  11:06 AM

## 2019-12-31 NOTE — Op Note (Signed)
[facility] Gastroenterology Patient Name: Ricky Petersen Procedure Date: 12/31/2019 10:58 AM MRN: [id number] Account #: [id number] Date of Birth: [birth year] Admit Type: Outpatient Age: 74 Room: [facility] ENDO ROOM 1 Gender: Male Note Status: Finalized Procedure:             Colonoscopy Indications:           High risk colon cancer surveillance: Personal history                         of multiple (3 or more) adenomas Providers:             Andrey Farmer MD, MD Referring MD:          Tracie Harrier, MD (Referring MD) Medicines:             Monitored Anesthesia Care Complications:         No immediate complications. Procedure:             Pre-Anesthesia Assessment:                        - Prior to the procedure, a History and Physical was                         performed, and patient medications and allergies were                         reviewed. The patient is competent. The risks and                         benefits of the procedure and the sedation options and                         risks were discussed with the patient. All questions                         were answered and informed consent was obtained.                         Patient identification and proposed procedure were                         verified by the physician, the nurse, the anesthetist                         and the technician in the endoscopy suite. Mental                         Status Examination: alert and oriented. Airway                         Examination: normal oropharyngeal airway and neck                         mobility. Respiratory Examination: clear to                         auscultation. CV Examination: normal. Prophylactic  Antibiotics: The patient does not require prophylactic                         antibiotics. Prior Anticoagulants: The patient has                         taken no previous anticoagulant or antiplatelet                          agents. ASA Grade Assessment: II - A patient with mild                         systemic disease. After reviewing the risks and                         benefits, the patient was deemed in satisfactory                         condition to undergo the procedure. The anesthesia                         plan was to use monitored anesthesia care (MAC).                         Immediately prior to administration of medications,                         the patient was re-assessed for adequacy to receive                         sedatives. The heart rate, respiratory rate, oxygen                         saturations, blood pressure, adequacy of pulmonary                         ventilation, and response to care were monitored                         throughout the procedure. The physical status of the                         patient was re-assessed after the procedure.                        After obtaining informed consent, the colonoscope was                         passed under direct vision. Throughout the procedure,                         the patient's blood pressure, pulse, and oxygen                         saturations were monitored continuously. The                         Colonoscope was introduced through the anus and  advanced to the the cecum, identified by appendiceal                         orifice and ileocecal valve. The colonoscopy was                         technically difficult and complex due to significant                         looping. Successful completion of the procedure was                         aided by applying abdominal pressure. The patient                         tolerated the procedure well. The quality of the bowel                         preparation was good. Findings:      The perianal and digital rectal examinations were normal.      Non-bleeding internal hemorrhoids were found during retroflexion. The       hemorrhoids were Grade I  (internal hemorrhoids that do not prolapse).      The exam was otherwise without abnormality on direct and retroflexion       views. Impression:            - Non-bleeding internal hemorrhoids.                        - The examination was otherwise normal on direct and                         retroflexion views.                        - No specimens collected. Recommendation:        - Discharge patient to home.                        - Resume previous diet.                        - Continue present medications.                        - Repeat colonoscopy is not recommended due to current                         age (63 years or older) for surveillance.                        - Return to referring physician as previously                         scheduled. Procedure Code(s):     --- Professional ---                        L9379, Colorectal cancer screening; colonoscopy on  individual at high risk Diagnosis Code(s):     --- Professional ---                        Z86.010, Personal history of colonic polyps                        K64.0, First degree hemorrhoids CPT copyright 2019 American Medical Association. All rights reserved. The codes documented in this report are preliminary and upon coder review may  be revised to meet current compliance requirements. Andrey Farmer, MD Andrey Farmer MD, MD 12/31/2019 11:48:54 AM Number of Addenda: 0 Note Initiated On: 12/31/2019 10:58 AM Scope Withdrawal Time: 0 hours 10 minutes 59 seconds  Total Procedure Duration: 0 hours 20 minutes 59 seconds  Estimated Blood Loss:  Estimated blood loss: none.      [facility]

## 2019-12-31 NOTE — Anesthesia Preprocedure Evaluation (Signed)
Anesthesia Evaluation  Patient identified by MRN, date of birth, ID band Patient awake    Reviewed: Allergy & Precautions, NPO status , Patient's Chart, lab work & pertinent test results  History of Anesthesia Complications Negative for: history of anesthetic complications  Airway Mallampati: II  TM Distance: >3 FB Neck ROM: Full    Dental no notable dental hx.    Pulmonary sleep apnea and Continuous Positive Airway Pressure Ventilation , neg COPD, former smoker,    breath sounds clear to auscultation- rhonchi (-) wheezing      Cardiovascular hypertension, Pt. on medications +CHF  (-) CAD, (-) Past MI, (-) Cardiac Stents and (-) CABG  Rhythm:Regular Rate:Normal - Systolic murmurs and - Diastolic murmurs Echo 1/0/93: 1. Limited study to assess ventricular function.  2. The left ventricle is mildly dilated in size with normal wall thickness.  3. The left ventricular systolic function is normal, LVEF is visually  estimated at 55%.  4. There is grade I diastolic dysfunction (impaired relaxation).  5. The left atrium is mildly dilated in size.  6. The right ventricle is mildly dilated in size, with normal systolic  function.  7. The right atrium is mildly dilated in size.      Neuro/Psych neg Seizures negative neurological ROS  negative psych ROS   GI/Hepatic Neg liver ROS, GERD  ,  Endo/Other  negative endocrine ROSneg diabetes  Renal/GU negative Renal ROS     Musculoskeletal negative musculoskeletal ROS (+)   Abdominal (+) - obese,   Peds  Hematology  (+) anemia ,   Anesthesia Other Findings Past Medical History: No date: Anemia No date: COVID-19 No date: Dysrhythmia No date: GERD (gastroesophageal reflux disease) No date: Heart murmur No date: Hypertension No date: Idiopathic cardiomyopathy ([place]) No date: Mixed hyperlipidemia No date: OSA (obstructive sleep apnea) No date: Sleep apnea    Reproductive/Obstetrics                             Anesthesia Physical Anesthesia Plan  ASA: III  Anesthesia Plan: General   Post-op Pain Management:    Induction: Intravenous  PONV Risk Score and Plan: 1 and Propofol infusion  Airway Management Planned: Natural Airway  Additional Equipment:   Intra-op Plan:   Post-operative Plan:   Informed Consent: I have reviewed the patients History and Physical, chart, labs and discussed the procedure including the risks, benefits and alternatives for the proposed anesthesia with the patient or authorized representative who has indicated his/her understanding and acceptance.     Dental advisory given  Plan Discussed with: CRNA and Anesthesiologist  Anesthesia Plan Comments:         Anesthesia Quick Evaluation

## 2020-01-01 ENCOUNTER — Encounter: Payer: Self-pay | Admitting: Gastroenterology

## 2020-03-10 ENCOUNTER — Other Ambulatory Visit: Payer: Self-pay | Admitting: Interventional Radiology

## 2020-03-10 DIAGNOSIS — Z9889 Other specified postprocedural states: Secondary | ICD-10-CM

## 2020-03-10 DIAGNOSIS — J90 Pleural effusion, not elsewhere classified: Secondary | ICD-10-CM

## 2021-02-05 ENCOUNTER — Other Ambulatory Visit (HOSPITAL_BASED_OUTPATIENT_CLINIC_OR_DEPARTMENT_OTHER): Payer: Self-pay | Admitting: Internal Medicine

## 2021-02-05 ENCOUNTER — Other Ambulatory Visit: Payer: Self-pay | Admitting: Internal Medicine

## 2021-02-05 DIAGNOSIS — M4807 Spinal stenosis, lumbosacral region: Secondary | ICD-10-CM

## 2021-02-05 DIAGNOSIS — Z0000 Encounter for general adult medical examination without abnormal findings: Secondary | ICD-10-CM

## 2021-02-13 ENCOUNTER — Ambulatory Visit
Admission: RE | Admit: 2021-02-13 | Discharge: 2021-02-13 | Disposition: A | Discharge status: Home / Self Care | Payer: Medicare Other | Source: Ambulatory Visit | Attending: Internal Medicine | Admitting: Internal Medicine

## 2021-02-13 ENCOUNTER — Other Ambulatory Visit: Payer: Self-pay

## 2021-02-13 ENCOUNTER — Encounter: Payer: Self-pay | Admitting: Urology

## 2021-02-13 ENCOUNTER — Ambulatory Visit: Payer: Medicare Other | Admitting: Urology

## 2021-02-13 VITALS — BP 122/78 | HR 55 | Ht 76.0 in | Wt 232.0 lb

## 2021-02-13 DIAGNOSIS — R972 Elevated prostate specific antigen [PSA]: Secondary | ICD-10-CM

## 2021-02-13 DIAGNOSIS — M4807 Spinal stenosis, lumbosacral region: Secondary | ICD-10-CM

## 2021-02-13 DIAGNOSIS — Z0000 Encounter for general adult medical examination without abnormal findings: Secondary | ICD-10-CM

## 2021-02-13 DIAGNOSIS — N400 Enlarged prostate without lower urinary tract symptoms: Secondary | ICD-10-CM | POA: Diagnosis not present

## 2021-02-13 MED ORDER — TAMSULOSIN HCL 0.4 MG PO CAPS: 0.4000 mg | ORAL_CAPSULE | Freq: Every day | ORAL | 0 refills | Status: AC

## 2021-02-13 NOTE — Progress Notes (Signed)
02/13/2021 11:23 AM   Ricky Petersen February 27, 1946 [id number]  Referring provider: Tracie Harrier, MD [address] [facility] [place],  [place] [address]  Chief Complaint  Patient presents with   Elevated PSA    HPI:  Ricky Petersen is a 75 y.o. male referred for evaluation of an elevated PSA.  PSA 01/29/2021 was 4.23 (2.68 August 2021) Baseline PSA since 2018 mid 2-upper 3 range Urinalysis was normal No bothersome LUTS Denies dysuria, gross hematuria Denies flank, abdominal or pelvic pain Family history prostate cancer in father  PMH: Past Medical History:  Diagnosis Date   Anemia    COVID-19    Dysrhythmia    GERD (gastroesophageal reflux disease)    Heart murmur    Hypertension    Idiopathic cardiomyopathy ([facility])    Mixed hyperlipidemia    OSA (obstructive sleep apnea)    Sleep apnea     Surgical History: Past Surgical History:  Procedure Laterality Date   CARDIAC CATHETERIZATION     COLONOSCOPY N/A 12/31/2019   Procedure: COLONOSCOPY;  Surgeon: [name], MD;  Location: [facility] ENDOSCOPY;  Service: Endoscopy;  Laterality: N/A;   COLONOSCOPY WITH PROPOFOL N/A 08/16/2016   Procedure: COLONOSCOPY WITH PROPOFOL;  Surgeon: Manya Silvas, MD;  Location: [facility] ENDOSCOPY;  Service: Endoscopy;  Laterality: N/A;   LUNG SURGERY  12/1998    Home Medications:  Allergies as of 02/13/2021   No Known Allergies      Medication List        Accurate as of February 13, 2021 11:23 AM. If you have any questions, ask your nurse or doctor.          STOP taking these medications    cefdinir 300 MG capsule Commonly known as: OMNICEF Stopped by: Abbie Sons, MD   Eliquis DVT/PE Starter Pack 5 MG Tabs Stopped by: Abbie Sons, MD   HYDROcodone-acetaminophen 5-325 MG tablet Commonly known as: NORCO/VICODIN Stopped by: Abbie Sons, MD       TAKE these medications    aspirin EC 81 MG tablet Take 81 mg by mouth daily.    fluticasone 50 MCG/ACT nasal spray Commonly known as: FLONASE Place 2 sprays into both nostrils daily.   hydrochlorothiazide 25 MG tablet Commonly known as: HYDRODIURIL Take 25 mg by mouth daily.   levocetirizine 5 MG tablet Commonly known as: XYZAL Take 5 mg by mouth every evening.   losartan 100 MG tablet Commonly known as: COZAAR Take 150 mg by mouth daily.   metoprolol succinate 50 MG 24 hr tablet Commonly known as: TOPROL-XL Take 50 mg by mouth daily.   omeprazole 20 MG capsule Commonly known as: PRILOSEC Take 20 mg by mouth 2 (two) times daily before a meal.   sotalol 120 MG tablet Commonly known as: BETAPACE Take 120 mg by mouth 2 (two) times daily.   tamsulosin 0.4 MG Caps capsule Commonly known as: FLOMAX Take 1 capsule (0.4 mg total) by mouth daily. Started by: Abbie Sons, MD   vitamin B-12 500 MCG tablet Commonly known as: CYANOCOBALAMIN Take 500 mcg by mouth daily.        Allergies: No Known Allergies  Family History: No family history on file.  Social History:  reports that he quit smoking about 42 years ago. His smoking use included cigarettes. He has a 15.00 pack-year smoking history. He has never used smokeless tobacco. He reports current alcohol use. He reports that he does not use drugs.  Physical Exam: BP 122/78   Pulse (!) 55   Ht 6\' 4"  (1.93 m)   Wt 232 lb (105.2 kg)   BMI 28.24 kg/m   Constitutional:  Alert and oriented, No acute distress. HEENT: [place] AT, moist mucus membranes.  Trachea midline, no masses. Cardiovascular: No clubbing, cyanosis, or edema. Respiratory: Normal respiratory effort, no increased work of breathing. GU: Prostate 50 g, smooth without nodules Skin: No rashes, bruises or suspicious lesions. Neurologic: Grossly intact, no focal deficits, moving all 4 extremities. Psychiatric: Normal mood and affect.   Assessment & Plan:    1.  Elevated PSA Although PSA is a prostate cancer screening test he was  informed that cancer is not the most common cause of an elevated PSA. Other potential causes including BPH and inflammation were discussed. He was informed that the only way to adequately diagnose prostate cancer would be a transrectal ultrasound and biopsy of the prostate. The procedure was discussed including potential risks of bleeding and infection/sepsis. He was also informed that a negative biopsy does not conclusively rule out the possibility that prostate cancer may be present and that continued monitoring is required. The use of newer adjunctive blood tests including 4kScore was discussed. The use of multiparametric prostate MRI to identify lesions suspicious for high-grade prostate cancer and increasing diagnostic yield through targeted biopsy was also discussed as well as continued surveillance.  Due to transient PSA elevation secondary to inflammation have initially recommended a repeat PSA in 1 month.  Rx tamsulosin 0.4 mg daily sent to pharmacy He will think over the above options if PSA remains elevated   Abbie Sons, MD  [facility] [address] [place], [place] [address] [phone]

## 2021-02-15 ENCOUNTER — Encounter: Payer: Self-pay | Admitting: Urology

## 2021-02-18 ENCOUNTER — Other Ambulatory Visit: Payer: Self-pay | Admitting: Internal Medicine

## 2021-02-18 DIAGNOSIS — M5412 Radiculopathy, cervical region: Secondary | ICD-10-CM

## 2021-03-03 ENCOUNTER — Other Ambulatory Visit: Payer: Self-pay

## 2021-03-03 ENCOUNTER — Ambulatory Visit
Admission: RE | Admit: 2021-03-03 | Discharge: 2021-03-03 | Disposition: A | Discharge status: Home / Self Care | Payer: Medicare Other | Source: Ambulatory Visit | Attending: Internal Medicine | Admitting: Internal Medicine

## 2021-03-03 DIAGNOSIS — M5412 Radiculopathy, cervical region: Secondary | ICD-10-CM | POA: Insufficient documentation

## 2021-03-03 MED ORDER — GADOBUTROL 1 MMOL/ML IV SOLN
10.0000 mL | Freq: Once | INTRAVENOUS | Status: AC | PRN
  Administered 2021-03-03: 10 mL via INTRAVENOUS

## 2021-03-16 ENCOUNTER — Other Ambulatory Visit: Payer: Medicare Other

## 2021-03-16 ENCOUNTER — Other Ambulatory Visit: Payer: Self-pay

## 2021-03-16 DIAGNOSIS — R972 Elevated prostate specific antigen [PSA]: Secondary | ICD-10-CM

## 2021-03-17 LAB — PSA: Prostate Specific Ag, Serum: 4.4 ng/mL — ABNORMAL HIGH (ref 0.0–4.0)

## 2021-03-18 ENCOUNTER — Encounter: Payer: Self-pay | Admitting: *Deleted

## 2021-05-06 IMAGING — DX DG CHEST 1V PORT
1 series · 1 of 1 positions shown · non-contrast
Comparison: None.

CLINICAL DATA: Dyspnea, [id number].

EXAM:
PORTABLE CHEST 1 VIEW

[chest ap]
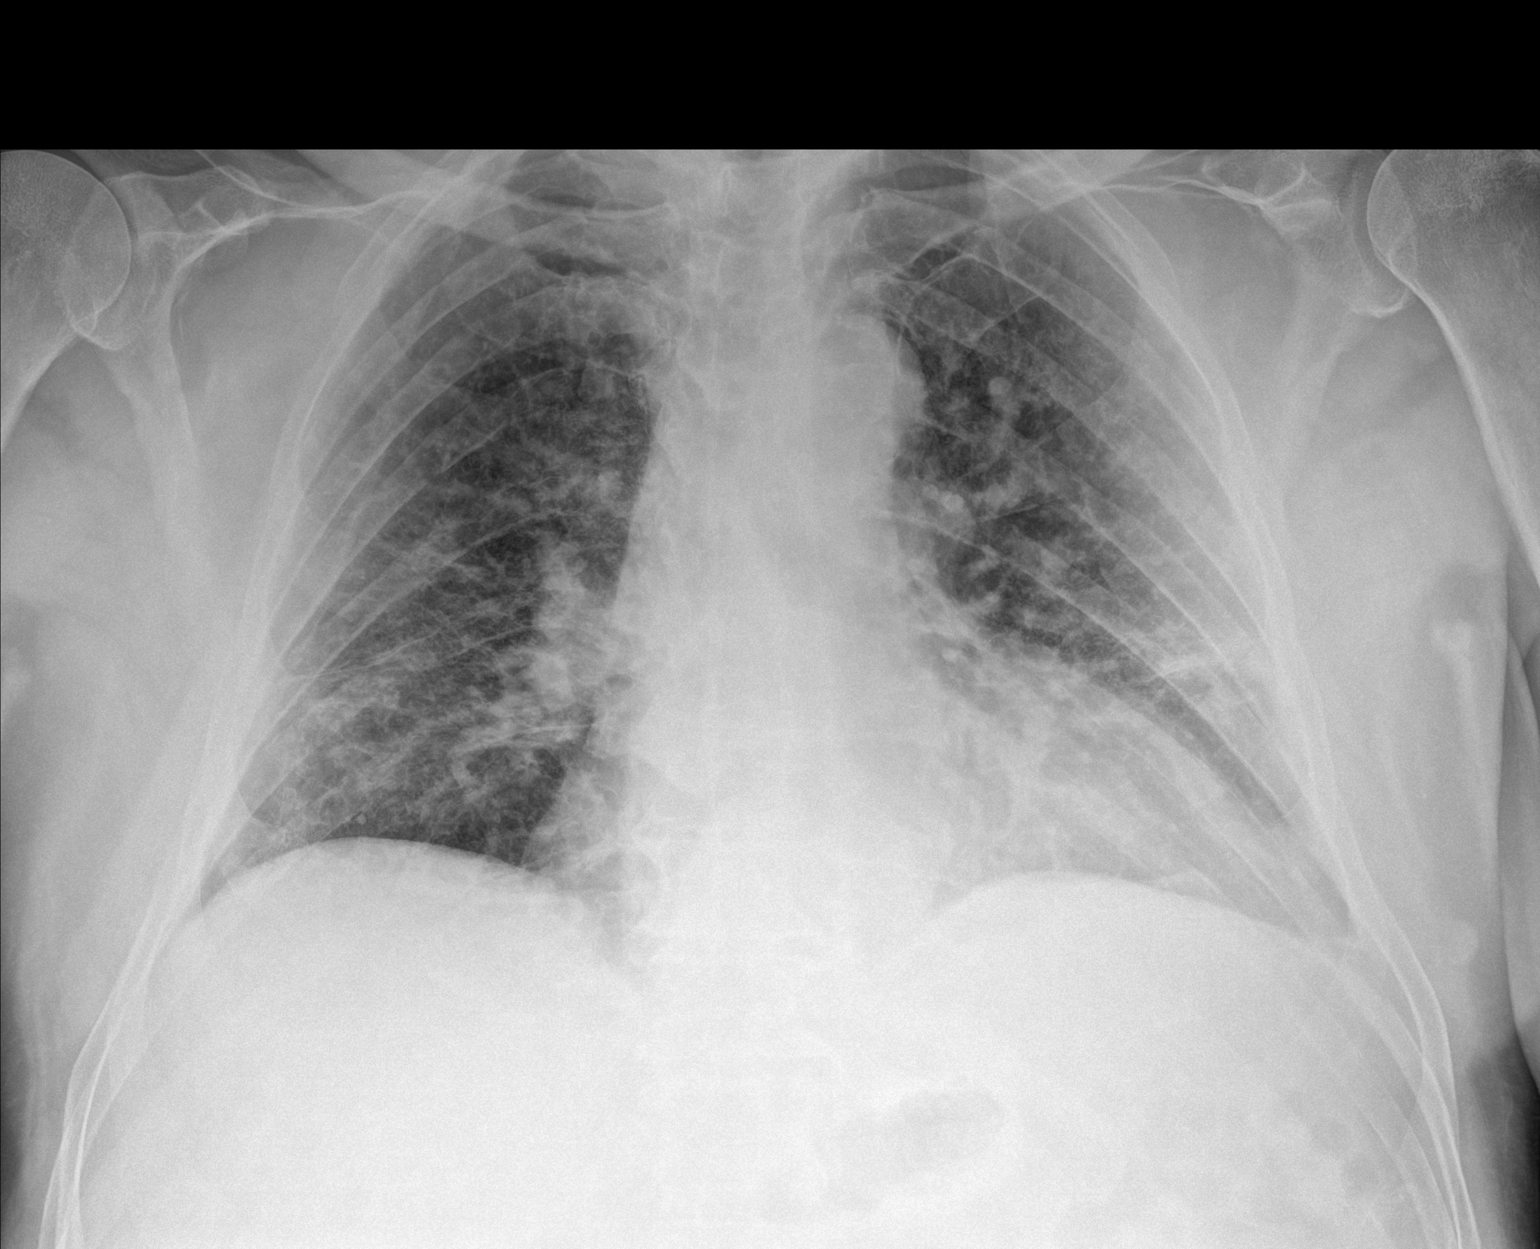

[1 of 1 positions shown; findings below may reference images not displayed]

FINDINGS: Borderline cardiomegaly. Streaky bilateral perihilar and bibasilar
opacities. No pleural effusion or pneumothorax seen. Osseous
structures about the chest are unremarkable.
IMPRESSION: 1. Borderline cardiomegaly.
2. Streaky bilateral perihilar and basilar opacities. This could
represent pneumonia, atelectasis or asymmetric edema.

## 2021-05-21 IMAGING — CT CT ANGIO CHEST
2 of 6 series · 18 of 46 positions shown · IV contrast (APPLIED)
Comparison: CT chest 02/25/2014

CLINICAL DATA: Patient with chest pain and soreness. Recently
discharged after being diagnosed with [id number].

EXAM:
CT ANGIOGRAPHY CHEST WITH CONTRAST
TECHNIQUE: Multidetector CT imaging of the chest was performed using the
standard protocol during bolus administration of intravenous
contrast. Multiplanar CT image reconstructions and MIPs were
obtained to evaluate the vascular anatomy.
CONTRAST:  75mL OMNIPAQUE IOHEXOL 350 MG/ML SOLN

[Series 5: thins · axial · 0.72mm/px · z∈[-169,+104]mm · 15 of 301 slices shown]
[im 14/301  lung]
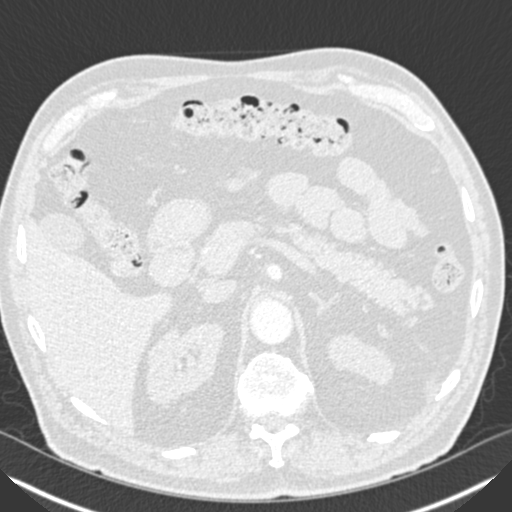
[im 40/301  soft-tissue]
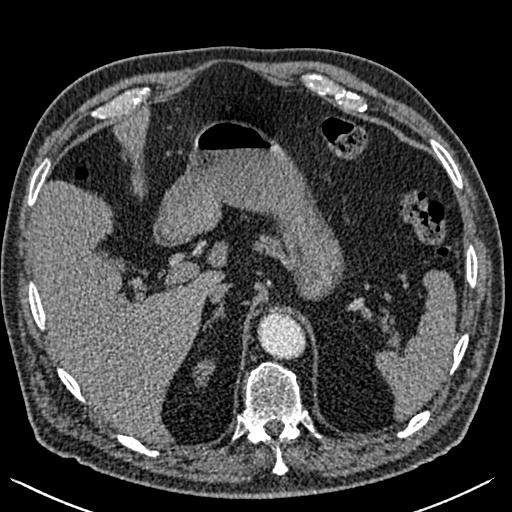
[im 53/301  lung]
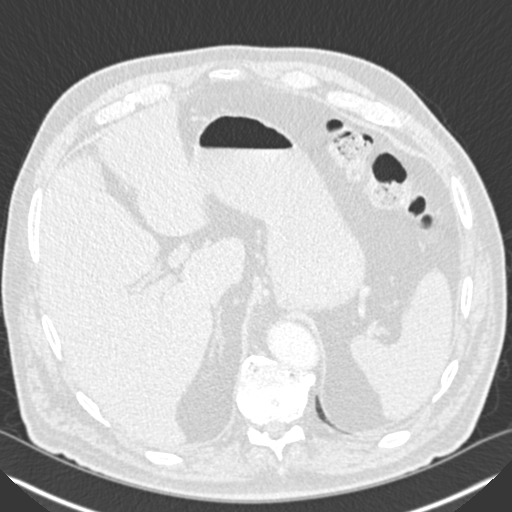
[im 79/301  soft-tissue]
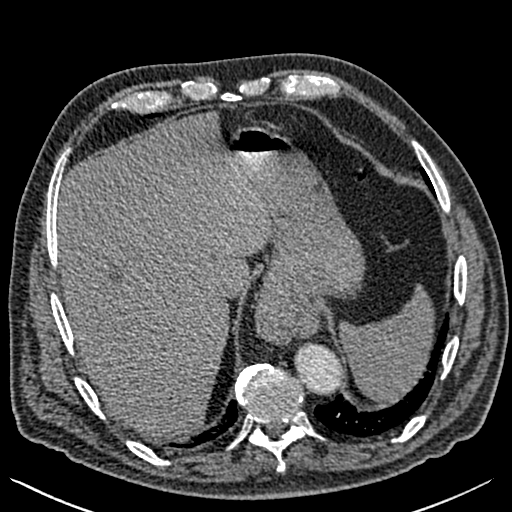
[im 92/301  lung]
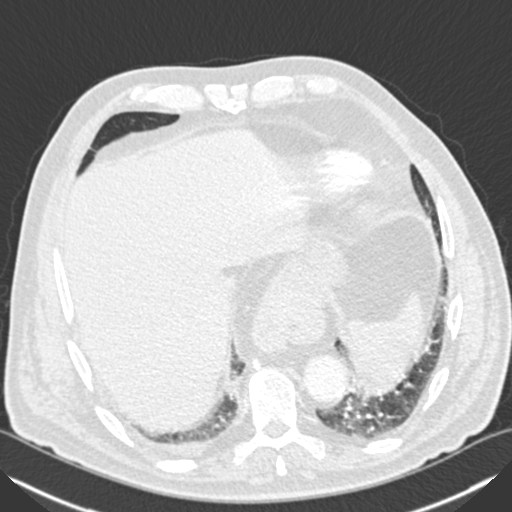
[im 118/301  soft-tissue]
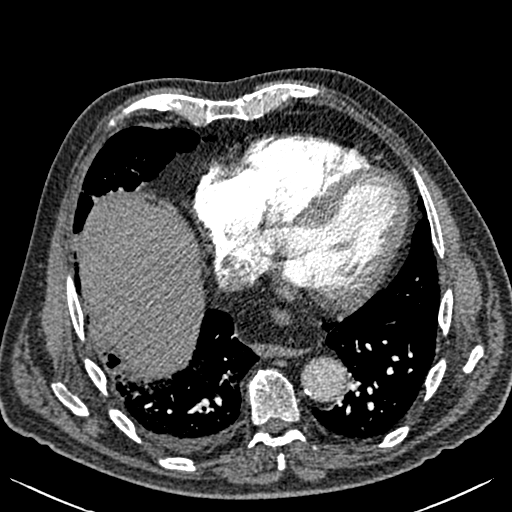
[im 131/301  lung]
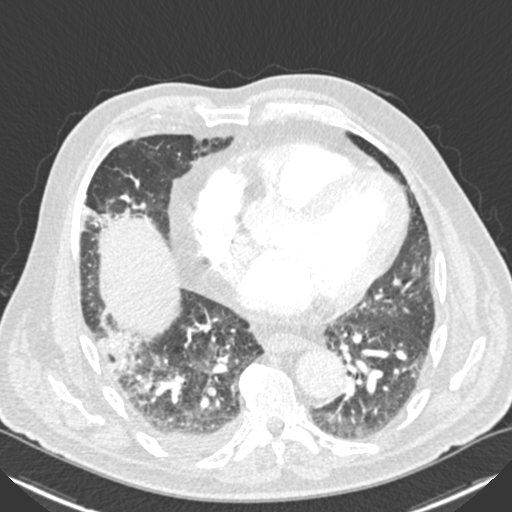
[im 157/301  soft-tissue]
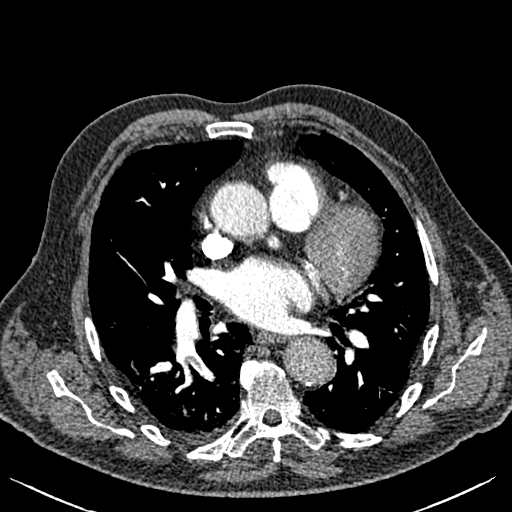
[im 170/301  lung]
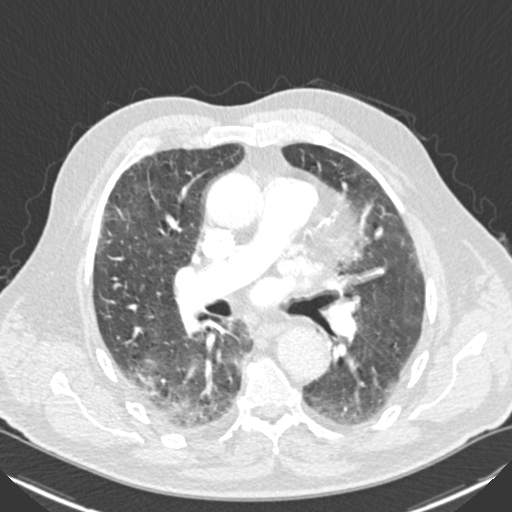
[im 183/301  soft-tissue]
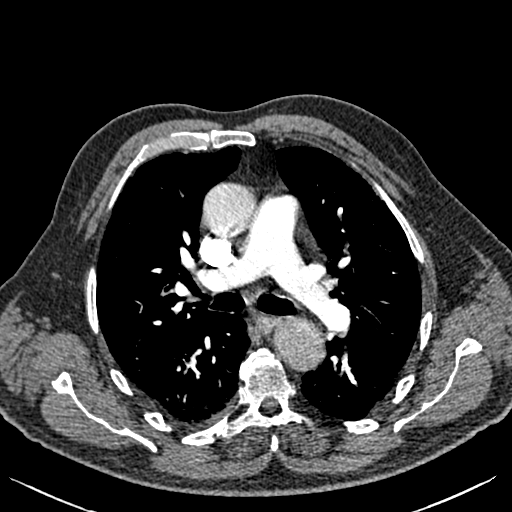
[im 209/301  lung]
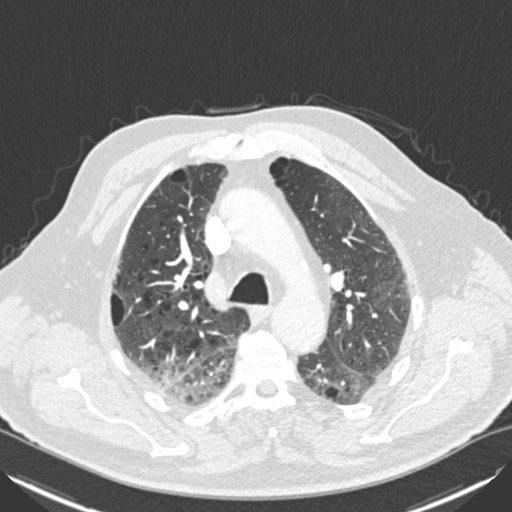
[im 222/301  soft-tissue]
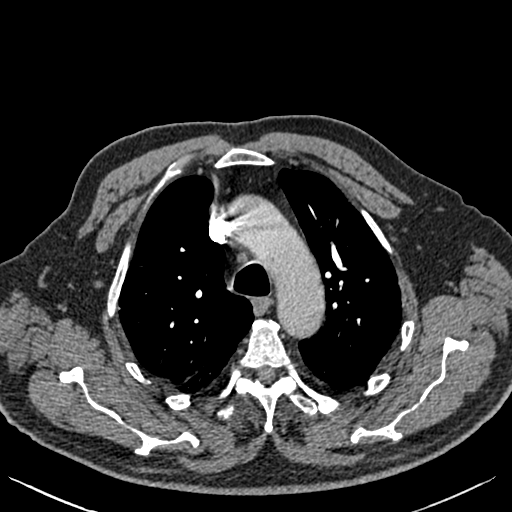
[im 248/301  lung]
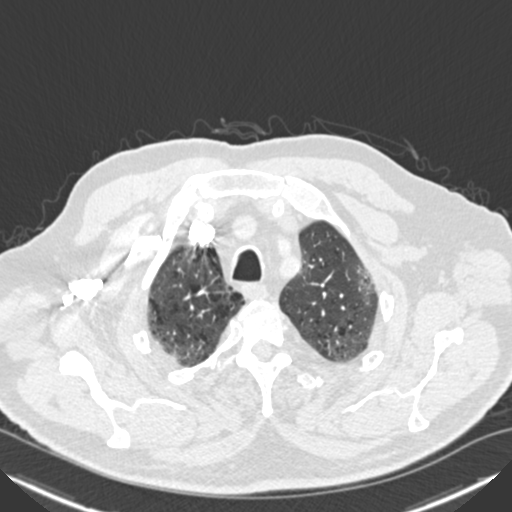
[im 261/301  soft-tissue]
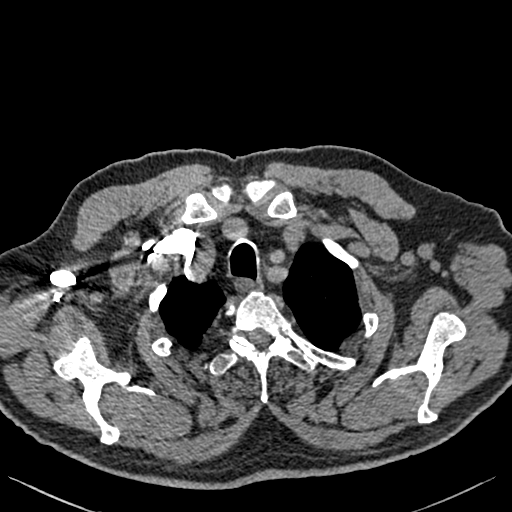
[im 287/301  lung]
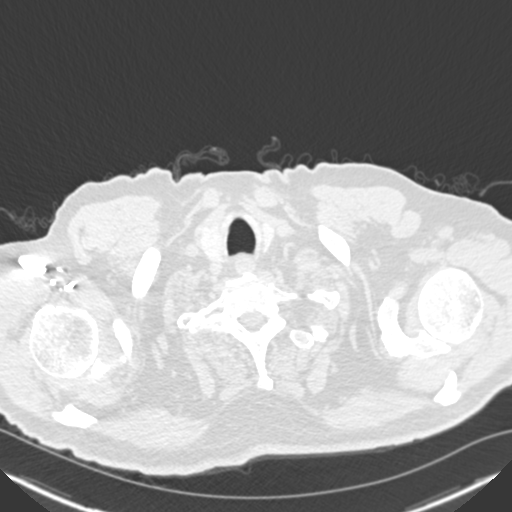

[Series 7: coronal mpr · coronal · 0.60mm/px · 3 of 107 slices shown]
[im 27/107  soft-tissue]
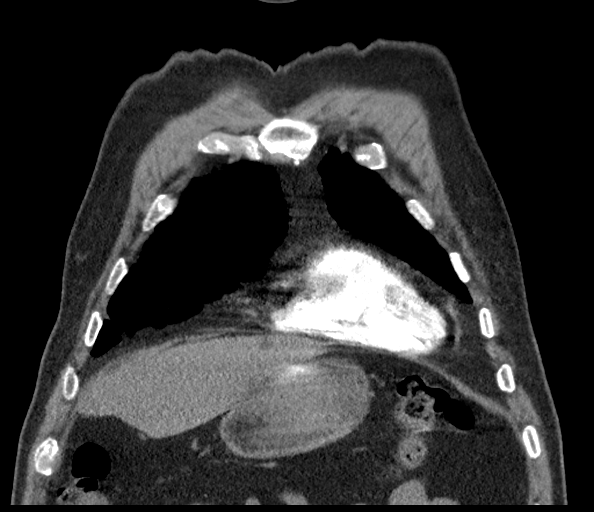
[im 54/107  soft-tissue]
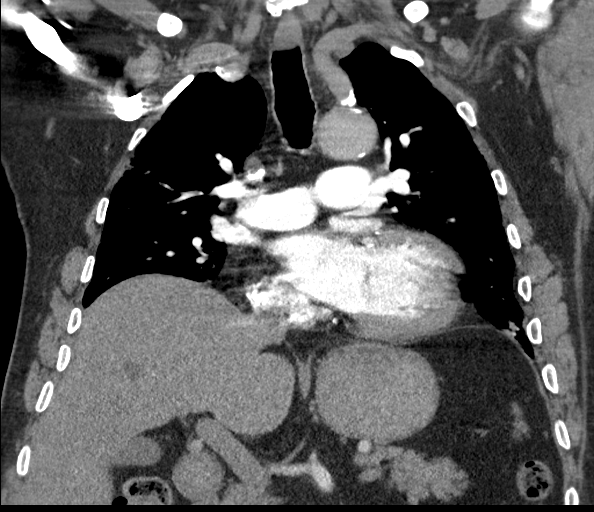
[im 80/107  soft-tissue]
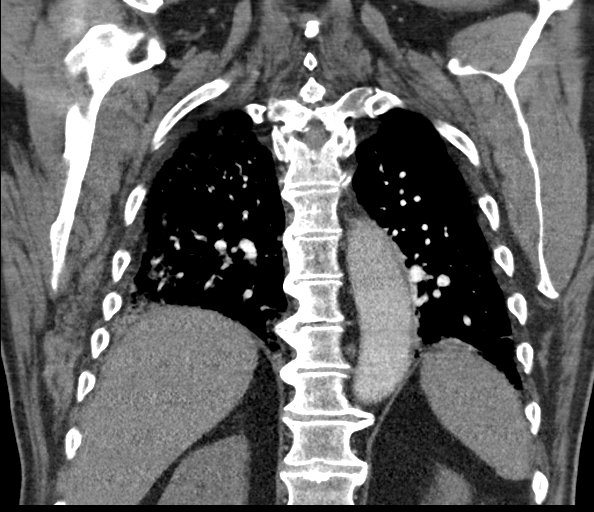

[18 of 46 positions shown; findings below may reference images not displayed]

FINDINGS: Cardiovascular: Heart is mildly enlarged. Trace fluid superior
pericardial recess. Thoracic aortic vascular calcifications.
Adequate opacification of the pulmonary arterial system. Mild motion
artifact limits evaluation. Filling defect within the right lower
lobe segmental and subsegmental pulmonary arteries (image 143;
series 5).

Mediastinum/Nodes: No enlarged axillary, mediastinal or hilar
lymphadenopathy. Normal appearance of the esophagus. Small hiatal
hernia.

Lungs/Pleura: Central airways are patent. Centrilobular and
paraseptal emphysematous changes. Fairly extensive bilateral
peripheral ground-glass opacities most pronounced within the lower
lobes. Within the right lower lobe there is a 2.5 x 2.8 cm
subpleural centrally cavitary nodular area of consolidation (image
58; series 4).

Upper Abdomen: Similar subcentimeter low-attenuation hepatic
lesions. No acute process.

Musculoskeletal: Thoracic spine degenerative changes. No aggressive
or acute appearing osseous lesions.

Review of the MIP images confirms the above findings.
IMPRESSION: 1. Findings compatible with acute pulmonary embolus predominately
involving the right lower lobe segmental and subsegmental pulmonary
arteries.
2. Within the subpleural right lower lobe there is a 2.5 cm central
cavitary nodular area of consolidation which may represent an
evolving pulmonary infarct.
3. Additional bilateral patchy predominately ground-glass opacities
which may represent atypical/viral infectious process.
4. Aortic Atherosclerosis (K5VFO-563.3) and Emphysema (K5VFO-O35.E).
5. Critical Value/emergent results were called by telephone at the
time of interpretation on 01/01/2019 at [DATE] to Antwan
SIBUSISO SBU , who verbally acknowledged these results.

## 2021-09-17 ENCOUNTER — Other Ambulatory Visit: Payer: Medicare Other

## 2021-09-18 ENCOUNTER — Ambulatory Visit: Payer: Medicare Other | Admitting: Urology

## 2021-09-21 ENCOUNTER — Encounter: Payer: Self-pay | Admitting: Urology

## 2021-09-21 ENCOUNTER — Ambulatory Visit: Payer: Medicare Other | Admitting: Urology

## 2021-09-21 VITALS — BP 135/80 | HR 68 | Ht 76.0 in | Wt 230.0 lb

## 2021-09-21 DIAGNOSIS — N401 Enlarged prostate with lower urinary tract symptoms: Secondary | ICD-10-CM

## 2021-09-21 DIAGNOSIS — R972 Elevated prostate specific antigen [PSA]: Secondary | ICD-10-CM

## 2023-09-19 ENCOUNTER — Other Ambulatory Visit: Payer: Self-pay | Admitting: Neurology

## 2023-09-19 DIAGNOSIS — R200 Anesthesia of skin: Secondary | ICD-10-CM

## 2023-09-27 ENCOUNTER — Ambulatory Visit
Admission: RE | Admit: 2023-09-27 | Discharge: 2023-09-27 | Disposition: A | Discharge status: Home / Self Care | Source: Ambulatory Visit | Attending: Neurology | Admitting: Neurology

## 2023-09-27 DIAGNOSIS — R200 Anesthesia of skin: Secondary | ICD-10-CM | POA: Diagnosis present
